# Patient Record
Sex: Male | Born: 1960 | Race: White | Hispanic: No | Marital: Single | Smoking: Never smoker
Health system: Southern US, Community
[De-identification: ages and names within clinical notes are randomized; demographics above are authoritative.]

## PROBLEM LIST (undated history)

## (undated) DIAGNOSIS — E079 Disorder of thyroid, unspecified: Secondary | ICD-10-CM

## (undated) DIAGNOSIS — E7800 Pure hypercholesterolemia, unspecified: Secondary | ICD-10-CM

## (undated) DIAGNOSIS — G309 Alzheimer's disease, unspecified: Secondary | ICD-10-CM

## (undated) DIAGNOSIS — L309 Dermatitis, unspecified: Secondary | ICD-10-CM

## (undated) DIAGNOSIS — Q909 Down syndrome, unspecified: Secondary | ICD-10-CM

## (undated) DIAGNOSIS — J302 Other seasonal allergic rhinitis: Secondary | ICD-10-CM

## (undated) DIAGNOSIS — G4730 Sleep apnea, unspecified: Secondary | ICD-10-CM

## (undated) DIAGNOSIS — L409 Psoriasis, unspecified: Secondary | ICD-10-CM

## (undated) DIAGNOSIS — I82409 Acute embolism and thrombosis of unspecified deep veins of unspecified lower extremity: Secondary | ICD-10-CM

## (undated) DIAGNOSIS — F0280 Dementia in other diseases classified elsewhere without behavioral disturbance: Secondary | ICD-10-CM

## (undated) HISTORY — DX: Dementia in other diseases classified elsewhere, unspecified severity, without behavioral disturbance, psychotic disturbance, mood disturbance, and anxiety: G30.9

## (undated) HISTORY — PX: ANKLE SURGERY: SHX546

---

## 2010-04-10 ENCOUNTER — Emergency Department (HOSPITAL_BASED_OUTPATIENT_CLINIC_OR_DEPARTMENT_OTHER): Admission: EM | Admit: 2010-04-10 | Discharge: 2010-04-10 | Payer: Self-pay | Admitting: Emergency Medicine

## 2011-09-18 ENCOUNTER — Emergency Department (HOSPITAL_BASED_OUTPATIENT_CLINIC_OR_DEPARTMENT_OTHER)
Admission: EM | Admit: 2011-09-18 | Discharge: 2011-09-18 | Disposition: A | Discharge status: Other Acute Inpatient Hospital | Payer: Medicare Other | Attending: Emergency Medicine | Admitting: Emergency Medicine

## 2011-09-18 ENCOUNTER — Encounter: Payer: Self-pay | Admitting: *Deleted

## 2011-09-18 DIAGNOSIS — E079 Disorder of thyroid, unspecified: Secondary | ICD-10-CM | POA: Insufficient documentation

## 2011-09-18 DIAGNOSIS — Q909 Down syndrome, unspecified: Secondary | ICD-10-CM | POA: Insufficient documentation

## 2011-09-18 DIAGNOSIS — G4730 Sleep apnea, unspecified: Secondary | ICD-10-CM | POA: Insufficient documentation

## 2011-09-18 DIAGNOSIS — E7800 Pure hypercholesterolemia, unspecified: Secondary | ICD-10-CM | POA: Insufficient documentation

## 2011-09-18 DIAGNOSIS — M7989 Other specified soft tissue disorders: Secondary | ICD-10-CM | POA: Insufficient documentation

## 2011-09-18 HISTORY — DX: Sleep apnea, unspecified: G47.30

## 2011-09-18 HISTORY — DX: Pure hypercholesterolemia, unspecified: E78.00

## 2011-09-18 HISTORY — DX: Down syndrome, unspecified: Q90.9

## 2011-09-18 HISTORY — DX: Disorder of thyroid, unspecified: E07.9

## 2011-09-18 NOTE — ED Notes (Signed)
Pt presents to ED today with right knee swelling extending down right leg.  Pt reports no injury or trauma.  Pt took [place] last night with no relief in sx

## 2011-09-18 NOTE — ED Provider Notes (Signed)
History     CSN: [id number] Arrival date & time: 09/18/2011 10:23 AM   First MD Initiated Contact with Patient 09/18/11 1101      Chief Complaint  Patient presents with  . Joint Swelling    (Consider location/radiation/quality/duration/timing/severity/associated sxs/prior treatment) HPI Comments: Patient's history is limited due to his Down syndrome.  He has right leg swelling from his knee down to his ankle.  There is no erythema, redness or fevers noted.  No history of gout.  Patient is able to ambulate and can flex and extend his knee without difficulty.  Patient may have had a DVT in the past but it is unclear.  He's not on any anticoagulation or antiplatelet agents at this time.  No shortness of breath or chest pain.  It is unclear how long the swelling has been present.  Patient is a 50 y.o. male presenting with leg pain. The history is provided by the patient and a relative. The history is limited by a developmental delay. No language interpreter was used.  Leg Pain  The incident occurred at home. There was no injury mechanism.    Past Medical History  Diagnosis Date  . Down's syndrome   . Thyroid disease   . Hypercholesteremia   . Sleep apnea     Past Surgical History  Procedure Date  . Ankle surgery     bilateral    History reviewed. No pertinent family history.  History  Substance Use Topics  . Smoking status: Never Smoker   . Smokeless tobacco: Not on file  . Alcohol Use: No      Review of Systems  Constitutional: Negative.  Negative for fever and chills.  HENT: Negative.   Eyes: Negative.  Negative for discharge and redness.  Respiratory: Negative.  Negative for cough and shortness of breath.   Cardiovascular: Negative.  Negative for chest pain.  Gastrointestinal: Negative.  Negative for nausea, vomiting and abdominal pain.  Genitourinary: Negative.  Negative for hematuria.  Musculoskeletal: Positive for joint swelling. Negative for back pain.  Skin:  Negative.  Negative for color change and rash.  Neurological: Negative for syncope and headaches.  Hematological: Negative.  Negative for adenopathy.  Psychiatric/Behavioral: Negative.  Negative for confusion.  All other systems reviewed and are negative.    Allergies  Review of patient's allergies indicates no known allergies.  Home Medications   Current Outpatient Rx  Name Route Sig Dispense Refill  . OMEGA-3 FATTY ACIDS 1000 MG PO CAPS Oral Take 2 g by mouth daily.      . FUROSEMIDE 40 MG PO TABS Oral Take 40 mg by mouth daily.      Marland Kitchen LEVOTHYROXINE SODIUM 88 MCG PO TABS Oral Take 88 mcg by mouth daily.      Marland Kitchen LORATADINE 10 MG PO TABS Oral Take 10 mg by mouth daily.      . MELOXICAM 15 MG PO TABS Oral Take 15 mg by mouth daily.      Marland Kitchen POTASSIUM CHLORIDE CR PO Oral Take by mouth.      Marland Kitchen SIMVASTATIN 80 MG PO TABS Oral Take 80 mg by mouth at bedtime.      . TRIAMCINOLONE ACETONIDE 0.1 % EX CREA Topical Apply topically 2 (two) times daily.        BP 132/60  Pulse 59  Temp(Src) 97.6 F (36.4 C) (Oral)  Resp 20  SpO2 95%  Physical Exam  Constitutional: He appears well-developed and well-nourished.  HENT:  Head: Normocephalic and atraumatic.  Eyes: Conjunctivae and EOM are normal. Pupils are equal, round, and reactive to light.  Neck: Normal range of motion. Neck supple.  Pulmonary/Chest: Effort normal.  Musculoskeletal: He exhibits edema.       Patient has swelling of his right knee down to his right ankle.  No erythema no crepitus no warmth.  Patient is able to flex and extend his knee without difficulty.  Patient is able to bear weight at this time.  Neurological: He is alert.  Skin: Skin is warm and dry. No rash noted. No erythema.  Psychiatric: He has a normal mood and affect. His behavior is normal.    ED Course  Procedures (including critical care time)  Labs Reviewed - No data to display No results found.   No diagnosis found.    MDM  Patient has exam  consistent with possible DVT.  I have no ultrasound technician available today to perform this study necessary.  I have called the emergency physician at [facility] emergency department Dr. Ladona Ridgel and she states that they do have technician there and so I will transfer the patient over to the emergency department for evaluation.  The patient's family members going to drive him over and I feel that is safe at this point in time.        Nat Christen, MD 09/18/11 1115

## 2015-11-05 ENCOUNTER — Emergency Department (HOSPITAL_BASED_OUTPATIENT_CLINIC_OR_DEPARTMENT_OTHER): Payer: Medicare Other

## 2015-11-05 ENCOUNTER — Emergency Department (HOSPITAL_BASED_OUTPATIENT_CLINIC_OR_DEPARTMENT_OTHER)
Admission: EM | Admit: 2015-11-05 | Discharge: 2015-11-06 | Disposition: A | Discharge status: Home / Self Care | Payer: Medicare Other | Attending: Emergency Medicine | Admitting: Emergency Medicine

## 2015-11-05 ENCOUNTER — Encounter (HOSPITAL_BASED_OUTPATIENT_CLINIC_OR_DEPARTMENT_OTHER): Payer: Self-pay

## 2015-11-05 DIAGNOSIS — E079 Disorder of thyroid, unspecified: Secondary | ICD-10-CM | POA: Insufficient documentation

## 2015-11-05 DIAGNOSIS — R262 Difficulty in walking, not elsewhere classified: Secondary | ICD-10-CM | POA: Insufficient documentation

## 2015-11-05 DIAGNOSIS — M79601 Pain in right arm: Secondary | ICD-10-CM

## 2015-11-05 DIAGNOSIS — Q909 Down syndrome, unspecified: Secondary | ICD-10-CM | POA: Diagnosis not present

## 2015-11-05 DIAGNOSIS — M1990 Unspecified osteoarthritis, unspecified site: Secondary | ICD-10-CM | POA: Insufficient documentation

## 2015-11-05 DIAGNOSIS — M25471 Effusion, right ankle: Secondary | ICD-10-CM | POA: Insufficient documentation

## 2015-11-05 DIAGNOSIS — M25572 Pain in left ankle and joints of left foot: Secondary | ICD-10-CM | POA: Insufficient documentation

## 2015-11-05 DIAGNOSIS — Z791 Long term (current) use of non-steroidal anti-inflammatories (NSAID): Secondary | ICD-10-CM | POA: Diagnosis not present

## 2015-11-05 DIAGNOSIS — Z8669 Personal history of other diseases of the nervous system and sense organs: Secondary | ICD-10-CM | POA: Diagnosis not present

## 2015-11-05 DIAGNOSIS — R4182 Altered mental status, unspecified: Secondary | ICD-10-CM | POA: Diagnosis not present

## 2015-11-05 DIAGNOSIS — M25472 Effusion, left ankle: Secondary | ICD-10-CM | POA: Diagnosis not present

## 2015-11-05 DIAGNOSIS — R531 Weakness: Secondary | ICD-10-CM | POA: Diagnosis not present

## 2015-11-05 DIAGNOSIS — Z872 Personal history of diseases of the skin and subcutaneous tissue: Secondary | ICD-10-CM | POA: Insufficient documentation

## 2015-11-05 DIAGNOSIS — R630 Anorexia: Secondary | ICD-10-CM | POA: Insufficient documentation

## 2015-11-05 DIAGNOSIS — M25511 Pain in right shoulder: Secondary | ICD-10-CM | POA: Diagnosis not present

## 2015-11-05 DIAGNOSIS — M7989 Other specified soft tissue disorders: Secondary | ICD-10-CM

## 2015-11-05 DIAGNOSIS — E7800 Pure hypercholesterolemia, unspecified: Secondary | ICD-10-CM | POA: Diagnosis not present

## 2015-11-05 DIAGNOSIS — M79605 Pain in left leg: Secondary | ICD-10-CM | POA: Diagnosis present

## 2015-11-05 DIAGNOSIS — M549 Dorsalgia, unspecified: Secondary | ICD-10-CM | POA: Diagnosis not present

## 2015-11-05 DIAGNOSIS — Z79899 Other long term (current) drug therapy: Secondary | ICD-10-CM | POA: Diagnosis not present

## 2015-11-05 HISTORY — DX: Other seasonal allergic rhinitis: J30.2

## 2015-11-05 HISTORY — DX: Psoriasis, unspecified: L40.9

## 2015-11-05 HISTORY — DX: Dermatitis, unspecified: L30.9

## 2015-11-05 LAB — URINALYSIS, ROUTINE W REFLEX MICROSCOPIC
GLUCOSE, UA: NEGATIVE mg/dL
Hgb urine dipstick: NEGATIVE
KETONES UR: 15 mg/dL — AB
LEUKOCYTES UA: NEGATIVE
NITRITE: NEGATIVE
PROTEIN: 30 mg/dL — AB
Specific Gravity, Urine: 1.042 — ABNORMAL HIGH (ref 1.005–1.030)
pH: 5.5 (ref 5.0–8.0)

## 2015-11-05 LAB — CBC WITH DIFFERENTIAL/PLATELET
Basophils Absolute: 0 10*3/uL (ref 0.0–0.1)
Basophils Relative: 0 %
EOS ABS: 0.1 10*3/uL (ref 0.0–0.7)
Eosinophils Relative: 1 %
HCT: 42.1 % (ref 39.0–52.0)
HEMOGLOBIN: 13.5 g/dL (ref 13.0–17.0)
LYMPHS ABS: 0.6 10*3/uL — AB (ref 0.7–4.0)
Lymphocytes Relative: 6 %
MCH: 28.8 pg (ref 26.0–34.0)
MCHC: 32.1 g/dL (ref 30.0–36.0)
MCV: 90 fL (ref 78.0–100.0)
MONO ABS: 0.8 10*3/uL (ref 0.1–1.0)
MONOS PCT: 7 %
NEUTROS PCT: 86 %
Neutro Abs: 9.9 10*3/uL — ABNORMAL HIGH (ref 1.7–7.7)
Platelets: 231 10*3/uL (ref 150–400)
RBC: 4.68 MIL/uL (ref 4.22–5.81)
RDW: 15.8 % — ABNORMAL HIGH (ref 11.5–15.5)
WBC: 11.6 10*3/uL — ABNORMAL HIGH (ref 4.0–10.5)

## 2015-11-05 LAB — BASIC METABOLIC PANEL
Anion gap: 9 (ref 5–15)
BUN: 21 mg/dL — AB (ref 6–20)
CALCIUM: 8.8 mg/dL — AB (ref 8.9–10.3)
CHLORIDE: 99 mmol/L — AB (ref 101–111)
CO2: 30 mmol/L (ref 22–32)
CREATININE: 0.91 mg/dL (ref 0.61–1.24)
GFR calc Af Amer: >60 mL/min (ref >60)
Glucose, Bld: 124 mg/dL — ABNORMAL HIGH (ref 65–99)
Potassium: 4.2 mmol/L (ref 3.5–5.1)
SODIUM: 138 mmol/L (ref 135–145)

## 2015-11-05 LAB — URINE MICROSCOPIC-ADD ON: WBC UA: NONE SEEN WBC/hpf (ref 0–5)

## 2015-11-05 MED ORDER — ACETAMINOPHEN 500 MG PO TABS
1000.0000 mg | ORAL_TABLET | Freq: Once | ORAL | Status: AC
  Administered 2015-11-05: 1000 mg via ORAL
  Filled 2015-11-05: qty 2

## 2015-11-05 NOTE — ED Notes (Signed)
Patient transported to CT 

## 2015-11-05 NOTE — ED Provider Notes (Signed)
CSN: [id number]     Arrival date & time 11/05/15  1810 History  By signing my name below, I, Javier Morrison, attest that this documentation has been prepared under the direction and in the presence of Melene Plan, DO. Electronically Signed: Linus Morrison, ED Scribe. 11/05/2015. 9:09 PM.   Chief Complaint  Patient presents with  . Difficulty Walking   The history is provided by a caregiver and the patient. No language interpreter was used.   HPI Comments: Javier Morrison with his caregiver is a 55 y.o. male with a h/o Down's Syndrome and arthritis who presents to the Emergency Department complaining of difficulty ambulating since yesterday. Pt's caregiver also reports back pain, right shoulder pain with swelling, left leg pain with weight bearing, decreased appetite, and decreased mental status. Caregiver reports the pt was evaluated by his PCP yesterday and had benign left leg xray. As per caregiver, pt is usually very active with a good appetite at baseline. Pt denies any falls. Pt denies any fevers, chills, CP, SOB, or any other sx at this time.   Past Medical History  Diagnosis Date  . Down's syndrome   . Thyroid disease   . Hypercholesteremia   . Sleep apnea   . Psoriasis   . Seasonal allergies   . Eczema    Past Surgical History  Procedure Laterality Date  . Ankle surgery      bilateral   No family history on file. Social History  Substance Use Topics  . Smoking status: Never Smoker   . Smokeless tobacco: None  . Alcohol Use: No    Review of Systems  Constitutional: Positive for appetite change (decreased). Negative for fever and chills.  HENT: Negative for congestion and facial swelling.   Eyes: Negative for discharge and visual disturbance.  Respiratory: Negative for shortness of breath.   Cardiovascular: Negative for chest pain and palpitations.  Gastrointestinal: Negative for vomiting, abdominal pain and diarrhea.  Musculoskeletal: Positive for myalgias, back pain and  arthralgias.  Skin: Negative for color change and rash.  Neurological: Positive for weakness. Negative for tremors, syncope and headaches.  Psychiatric/Behavioral: Negative for confusion and dysphoric mood.    Allergies  Review of patient's allergies indicates no known allergies.  Home Medications   Prior to Admission medications   Medication Sig Start Date End Date Taking? Authorizing Provider  levothyroxine (SYNTHROID, LEVOTHROID) 88 MCG tablet Take 88 mcg by mouth daily.      Historical Provider, MD  loratadine (CLARITIN) 10 MG tablet Take 10 mg by mouth daily.      Historical Provider, MD  meloxicam (MOBIC) 15 MG tablet Take 15 mg by mouth daily.      Historical Provider, MD  POTASSIUM CHLORIDE CR PO Take by mouth.      Historical Provider, MD  simvastatin (ZOCOR) 80 MG tablet Take 80 mg by mouth at bedtime.      Historical Provider, MD   BP 100/55 mmHg  Pulse 88  Temp(Src) 97.9 F (36.6 C) (Oral)  Resp 20  Ht 5\' 1"  (1.549 m)  Wt 173 lb (78.472 kg)  BMI 32.70 kg/m2  SpO2 95% Physical Exam  Constitutional: He is oriented to person, place, and time. He appears well-developed and well-nourished.  HENT:  Head: Normocephalic and atraumatic.  Eyes: EOM are normal. Pupils are equal, round, and reactive to light.  Neck: Normal range of motion. Neck supple. No JVD present.  Cardiovascular: Normal rate and regular rhythm.  Exam reveals no gallop and no friction  rub.   No murmur heard. Pulmonary/Chest: No respiratory distress. He has no wheezes. He exhibits no tenderness.  Abdominal: He exhibits no distension. There is no tenderness. There is no rebound and no guarding.  Musculoskeletal: Normal range of motion.  Marked swelling RUE with some crepitus and moibility of humeral bone, pulse, motor and sensation intact right in the hand. Palpable clunk felt with internal rotation of left ankle. Left ankle more swollen compared to right. no hip or spine tenderness. No overt signs of trauma   Neurological: He is alert and oriented to person, place, and time.  Skin: No rash noted. No pallor.  Psychiatric: He has a normal mood and affect. His behavior is normal.  Nursing note and vitals reviewed.   ED Course  Procedures  DIAGNOSTIC STUDIES: Oxygen Saturation is 95% on room air, normal by my interpretation.    COORDINATION OF CARE: 8:29 PM WiIl order imaging. Will give Tylenol. Discussed treatment plan with pt at bedside and pt agreed to plan.  Labs Review Labs Reviewed  CBC WITH DIFFERENTIAL/PLATELET - Abnormal; Notable for the following:    WBC 11.6 (*)    RDW 15.8 (*)    Neutro Abs 9.9 (*)    Lymphs Abs 0.6 (*)    All other components within normal limits  BASIC METABOLIC PANEL - Abnormal; Notable for the following:    Chloride 99 (*)    Glucose, Bld 124 (*)    BUN 21 (*)    Calcium 8.8 (*)    All other components within normal limits  URINALYSIS, ROUTINE W REFLEX MICROSCOPIC (NOT AT [facility]) - Abnormal; Notable for the following:    Color, Urine AMBER (*)    APPearance CLOUDY (*)    Specific Gravity, Urine 1.042 (*)    Bilirubin Urine SMALL (*)    Ketones, ur 15 (*)    Protein, ur 30 (*)    All other components within normal limits  URINE MICROSCOPIC-ADD ON - Abnormal; Notable for the following:    Squamous Epithelial / LPF 6-30 (*)    Bacteria, UA RARE (*)    All other components within normal limits    Imaging Review Dg Shoulder Right  11/05/2015  CLINICAL DATA:  Down syndrome. Not walking for 3 days. Left ankle and right shoulder pain. EXAM: RIGHT SHOULDER - 2+ VIEW COMPARISON:  None. FINDINGS: Suggestion of lucency in the right humeral head. This may be due to artifact of positioning but avascular necrosis is not excluded. No evidence of acute fracture or dislocation of the right shoulder. Soft tissues are unremarkable. IMPRESSION: Suggestion of lucency in the right humeral head possibly indicating avascular necrosis. No acute fracture or dislocation.  Electronically Signed   By: Burman Nieves M.D.   On: 11/05/2015 22:57   Dg Elbow Complete Right  11/05/2015  CLINICAL DATA:  Pain. EXAM: RIGHT ELBOW - COMPLETE 3+ VIEW COMPARISON:  None. FINDINGS: There is no evidence of fracture, dislocation, or joint effusion. Osteoarthritic changes of the elbow joint are seen. There is mild posterior soft tissue edema. IV catheter is seen. IMPRESSION: No acute fracture or dislocation identified about the right elbow. Moderate osteoarthritic changes. Electronically Signed   By: Ted Mcalpine M.D.   On: 11/05/2015 22:57   Dg Forearm Right  11/05/2015  CLINICAL DATA:  Right forearm pain. No known injury. Initial encounter. EXAM: RIGHT FOREARM - 2 VIEW COMPARISON:  None. FINDINGS: There is no acute bony or joint abnormality. Advanced for age degenerative disease is seen  about the elbow. No focal bony lesion. IMPRESSION: No acute abnormality. Electronically Signed   By: Drusilla Kanner M.D.   On: 11/05/2015 22:57   Dg Tibia/fibula Left  11/05/2015  CLINICAL DATA:  Down syndrome. Not walking for 3 days. Left ankle and right shoulder pain. EXAM: LEFT TIBIA AND FIBULA - 2 VIEW COMPARISON:  None. FINDINGS: Postoperative changes with plate and screw fixation of an old fracture of the distal left fibula. Old healed fracture deformity of the distal tibia. Prominent degenerative changes demonstrated in the left knee and left ankle. Chondrocalcinosis in the knee. Sclerosis in the distal femoral metaphysis could represent enchondroma or bone infarct. No evidence of acute fracture or dislocation. Vascular calcifications. IMPRESSION: Degenerative changes in the left knee and left ankle. Old fracture deformities in the left ankle. Sclerosis in the distal femoral metaphysis may indicate bone infarct or enchondroma. Electronically Signed   By: Burman Nieves M.D.   On: 11/05/2015 22:59   Dg Ankle Complete Left  11/05/2015  CLINICAL DATA:  Right ankle pain for 3 days. The  patient refuses to walk. No known injury. Initial encounter. EXAM: LEFT ANKLE COMPLETE - 3+ VIEW COMPARISON:  None. FINDINGS: There is no acute bony or joint abnormality. Plate and screw fixation of remote distal fibular fracture is identified. The patient has markedly advanced tibiotalar osteoarthritis. Soft tissues are unremarkable. IMPRESSION: No acute abnormality. Remote healed fibular fracture with fixation hardware in place. Advanced tibiotalar osteoarthritis. Electronically Signed   By: Drusilla Kanner M.D.   On: 11/05/2015 22:58   Ct Head Wo Contrast  11/05/2015  CLINICAL DATA:  Not walking for 3 days. Complains of left ankle and right shoulder pain. History of Down's syndrome. EXAM: CT HEAD WITHOUT CONTRAST TECHNIQUE: Contiguous axial images were obtained from the base of the skull through the vertex without intravenous contrast. COMPARISON:  None. FINDINGS: Mild diffuse cerebral atrophy. No ventricular dilatation. No mass effect or midline shift. No abnormal extra-axial fluid collections. Gray-white matter junctions are distinct. Basal cisterns are not effaced. No evidence of acute intracranial hemorrhage. No depressed skull fractures. Mucosal thickening versus retention cyst seen in the right maxillary antrum. No acute air-fluid levels in the paranasal sinuses. Hypoaeration of left mastoid air cells. No mastoid effusions. Congenital nonunion of the posterior arch of C1. IMPRESSION: No acute intracranial abnormalities.  Mild atrophy. Electronically Signed   By: Burman Nieves M.D.   On: 11/05/2015 22:54   Dg Hip Unilat With Pelvis 2-3 Views Left  11/05/2015  CLINICAL DATA:  The patient refuses to walk. Left hip pain. Initial encounter. No known injury. EXAM: DG HIP (WITH OR WITHOUT PELVIS) 2-3V LEFT COMPARISON:  None. FINDINGS: There is no evidence of hip fracture or dislocation. There is no evidence of arthropathy or other focal bone abnormality. IMPRESSION: Negative exam. Electronically Signed    By: Drusilla Kanner M.D.   On: 11/05/2015 22:59   Dg Femur Min 2 Views Left  11/05/2015  CLINICAL DATA:  Pain, inability to ambulate. EXAM: LEFT FEMUR 2 VIEWS COMPARISON:  None. FINDINGS: There is no evidence of fracture or other focal bone lesions. An area of 2.6 cm coarse calcifications are seen within the soft tissues lateral to the distal femoral shaft, with uncertain significance. IMPRESSION: No acute fracture or dislocation identified about the left femur. 2.6 cm area of coarse soft tissue calcifications lateral to the distal femoral shaft. This may represent a sequela of prior trauma, such as calcified intramuscular hematoma. Partially calcified soft tissue mass is  also of consideration. Follow-up in 3-6 months may be considered. Electronically Signed   By: Ted Mcalpine M.D.   On: 11/05/2015 23:02   I have personally reviewed and evaluated these images and lab results as part of my medical decision-making.   EKG Interpretation None      MDM   Final diagnoses:  Left ankle pain    55 yo M with a significant past medical history of Down syndrome comes in with a chief complaint of refusing to walk. This been going on for the past couple days. Patient was seen by their family physician and had a normal x-ray of the left foot. Difficult to obtain history second 2 patient ability to communicate. Felt crepitus in the left ankle as well as had significant pain to the right humerus. Images of these areas did not show any acute fractures. Will place the patient in a ankle stirrup. Have him follow-up with his orthopedic physician.  11:18 PM:  I have discussed the diagnosis/risks/treatment options with the patient and family and believe the pt to be eligible for discharge home to follow-up with Ortho. We also discussed returning to the ED immediately if new or worsening sx occur. We discussed the sx which are most concerning (e.g., sudden worsening pain, fever, inability to tolerate by mouth)  that necessitate immediate return. Medications administered to the patient during their visit and any new prescriptions provided to the patient are listed below.  Medications given during this visit Medications  acetaminophen (TYLENOL) tablet 1,000 mg (1,000 mg Oral Given 11/05/15 2047)    New Prescriptions   No medications on file    The patient appears reasonably screen and/or stabilized for discharge and I doubt any other medical condition or other [facility] requiring further screening, evaluation, or treatment in the ED at this time prior to discharge.     I personally performed the services described in this documentation, which was scribed in my presence. The recorded information has been reviewed and is accurate.    Melene Plan, DO 11/05/15 2318

## 2015-11-05 NOTE — ED Notes (Signed)
Patient transported to X-ray 

## 2015-11-05 NOTE — Discharge Instructions (Signed)
I am not sure of the etiology of him not bearing weight.  The xrays showed no acute broken bones.  Follow up with his ortho.  Ankle Pain Ankle pain is a common symptom. The bones, cartilage, tendons, and muscles of the ankle joint perform a lot of work each day. The ankle joint holds your body weight and allows you to move around. Ankle pain can occur on either side or back of 1 or both ankles. Ankle pain may be sharp and burning or dull and aching. There may be tenderness, stiffness, redness, or warmth around the ankle. The pain occurs more often when a person walks or puts pressure on the ankle. CAUSES  There are many reasons ankle pain can develop. It is important to work with your caregiver to identify the cause since many conditions can impact the bones, cartilage, muscles, and tendons. Causes for ankle pain include:  Injury, including a break (fracture), sprain, or strain often due to a fall, sports, or a high-impact activity.  Swelling (inflammation) of a tendon (tendonitis).  Achilles tendon rupture.  Ankle instability after repeated sprains and strains.  Poor foot alignment.  Pressure on a nerve (tarsal tunnel syndrome).  Arthritis in the ankle or the lining of the ankle.  Crystal formation in the ankle (gout or pseudogout). DIAGNOSIS  A diagnosis is based on your medical history, your symptoms, results of your physical exam, and results of diagnostic tests. Diagnostic tests may include X-ray exams or a computerized magnetic scan (magnetic resonance imaging, MRI). TREATMENT  Treatment will depend on the cause of your ankle pain and may include:  Keeping pressure off the ankle and limiting activities.  Using crutches or other walking support (a cane or brace).  Using rest, ice, compression, and elevation.  Participating in physical therapy or home exercises.  Wearing shoe inserts or special shoes.  Losing weight.  Taking medications to reduce pain or swelling or  receiving an injection.  Undergoing surgery. HOME CARE INSTRUCTIONS   Only take over-the-counter or prescription medicines for pain, discomfort, or fever as directed by your caregiver.  Put ice on the injured area.  Put ice in a plastic bag.  Place a towel between your skin and the bag.  Leave the ice on for 15-20 minutes at a time, 03-04 times a day.  Keep your leg raised (elevated) when possible to lessen swelling.  Avoid activities that cause ankle pain.  Follow specific exercises as directed by your caregiver.  Record how often you have ankle pain, the location of the pain, and what it feels like. This information may be helpful to you and your caregiver.  Ask your caregiver about returning to work or sports and whether you should drive.  Follow up with your caregiver for further examination, therapy, or testing as directed. SEEK MEDICAL CARE IF:   Pain or swelling continues or worsens beyond 1 week.  You have an oral temperature above 102 F (38.9 C).  You are feeling unwell or have chills.  You are having an increasingly difficult time with walking.  You have loss of sensation or other new symptoms.  You have questions or concerns. MAKE SURE YOU:   Understand these instructions.  Will watch your condition.  Will get help right away if you are not doing well or get worse.   This information is not intended to replace advice given to you by your health care provider. Make sure you discuss any questions you have with your health care provider.  Document Released: 03/17/2010 Document Revised: 12/20/2011 Document Reviewed: 04/29/2015 Elsevier Interactive Patient Education [employer]

## 2015-11-05 NOTE — ED Notes (Signed)
[facility] home staff member Min[facility] states pt with difficulty walking yesterday-c/o back pain and shoulder pain since Monday-denies injury,fevers,-was seen by PCP yesterday-xray of left foot with negative-pt alert-NAD-when asked if havin pain pt points to right shoulder-denies again any injury

## 2018-08-22 ENCOUNTER — Emergency Department (HOSPITAL_BASED_OUTPATIENT_CLINIC_OR_DEPARTMENT_OTHER)
Admission: EM | Admit: 2018-08-22 | Discharge: 2018-08-22 | Disposition: A | Discharge status: Home / Self Care | Payer: Medicare Other | Attending: Emergency Medicine | Admitting: Emergency Medicine

## 2018-08-22 ENCOUNTER — Other Ambulatory Visit: Payer: Self-pay

## 2018-08-22 ENCOUNTER — Emergency Department (HOSPITAL_BASED_OUTPATIENT_CLINIC_OR_DEPARTMENT_OTHER): Payer: Medicare Other

## 2018-08-22 ENCOUNTER — Encounter (HOSPITAL_BASED_OUTPATIENT_CLINIC_OR_DEPARTMENT_OTHER): Payer: Self-pay | Admitting: *Deleted

## 2018-08-22 DIAGNOSIS — Z7901 Long term (current) use of anticoagulants: Secondary | ICD-10-CM | POA: Insufficient documentation

## 2018-08-22 DIAGNOSIS — Z79899 Other long term (current) drug therapy: Secondary | ICD-10-CM | POA: Diagnosis not present

## 2018-08-22 DIAGNOSIS — R0609 Other forms of dyspnea: Secondary | ICD-10-CM | POA: Diagnosis not present

## 2018-08-22 DIAGNOSIS — Z7982 Long term (current) use of aspirin: Secondary | ICD-10-CM | POA: Insufficient documentation

## 2018-08-22 DIAGNOSIS — Q909 Down syndrome, unspecified: Secondary | ICD-10-CM | POA: Insufficient documentation

## 2018-08-22 DIAGNOSIS — M79642 Pain in left hand: Secondary | ICD-10-CM | POA: Insufficient documentation

## 2018-08-22 LAB — COMPREHENSIVE METABOLIC PANEL
ALBUMIN: 2.8 g/dL — AB (ref 3.5–5.0)
ALT: 21 U/L (ref 0–44)
AST: 24 U/L (ref 15–41)
Alkaline Phosphatase: 70 U/L (ref 38–126)
Anion gap: 11 (ref 5–15)
BUN: 23 mg/dL — ABNORMAL HIGH (ref 6–20)
CO2: 27 mmol/L (ref 22–32)
CREATININE: 0.84 mg/dL (ref 0.61–1.24)
Calcium: 8.7 mg/dL — ABNORMAL LOW (ref 8.9–10.3)
Chloride: 97 mmol/L — ABNORMAL LOW (ref 98–111)
GFR calc non Af Amer: >60 mL/min (ref >60)
Glucose, Bld: 87 mg/dL (ref 70–99)
Potassium: 4 mmol/L (ref 3.5–5.1)
SODIUM: 135 mmol/L (ref 135–145)
Total Bilirubin: 0.6 mg/dL (ref 0.3–1.2)
Total Protein: 7.2 g/dL (ref 6.5–8.1)

## 2018-08-22 LAB — CBC WITH DIFFERENTIAL/PLATELET
ABS IMMATURE GRANULOCYTES: 0.02 10*3/uL (ref 0.00–0.07)
BASOS ABS: 0 10*3/uL (ref 0.0–0.1)
BASOS PCT: 0 %
Eosinophils Absolute: 0 10*3/uL (ref 0.0–0.5)
Eosinophils Relative: 0 %
HCT: 39.3 % (ref 39.0–52.0)
Hemoglobin: 12.4 g/dL — ABNORMAL LOW (ref 13.0–17.0)
Immature Granulocytes: 0 %
LYMPHS PCT: 13 %
Lymphs Abs: 1 10*3/uL (ref 0.7–4.0)
MCH: 28.2 pg (ref 26.0–34.0)
MCHC: 31.6 g/dL (ref 30.0–36.0)
MCV: 89.5 fL (ref 80.0–100.0)
Monocytes Absolute: 0.6 10*3/uL (ref 0.1–1.0)
Monocytes Relative: 8 %
NEUTROS ABS: 5.9 10*3/uL (ref 1.7–7.7)
NRBC: 0 % (ref 0.0–0.2)
Neutrophils Relative %: 79 %
Platelets: 324 10*3/uL (ref 150–400)
RBC: 4.39 MIL/uL (ref 4.22–5.81)
RDW: 16.5 % — ABNORMAL HIGH (ref 11.5–15.5)
WBC: 7.6 10*3/uL (ref 4.0–10.5)

## 2018-08-22 LAB — TROPONIN I: Troponin I: <0.03 ng/mL (ref <0.03)

## 2018-08-22 LAB — BRAIN NATRIURETIC PEPTIDE: B NATRIURETIC PEPTIDE 5: 72.1 pg/mL (ref 0.0–100.0)

## 2018-08-22 MED ORDER — IOPAMIDOL (ISOVUE-370) INJECTION 76%
100.0000 mL | Freq: Once | INTRAVENOUS | Status: AC | PRN
  Administered 2018-08-22: 100 mL via INTRAVENOUS

## 2018-08-22 NOTE — ED Notes (Signed)
Pt on monitor 

## 2018-08-22 NOTE — ED Provider Notes (Signed)
[facility] EMERGENCY DEPARTMENT Provider Note   CSN: [id number] Arrival date & time: 08/22/18  1012     History   Chief Complaint Chief Complaint  Patient presents with  . Hand Pain    HPI Javier Morrison is a 57 y.o. male.  The history is provided by the patient.  Hand Pain  This is a new problem. Episode onset: 1 month. The problem occurs constantly. The problem has been gradually worsening. Associated symptoms comments: Exertional SOB, left hand pain and arm swelling.  Decreased appetite and when he tries to walk he becomes winded and will turn white.  Pt currently complaining about pain in the palm of his left hand.  Caregiver states that he saw his doctor on Sunday and they did blood work which they said was normal and then put him on an antibiotic.  However she denies any fever or infectious symptoms.. The symptoms are aggravated by bending and twisting. Nothing relieves the symptoms. He has tried acetaminophen for the symptoms. The treatment provided no relief.    Past Medical History:  Diagnosis Date  . Down's syndrome   . Eczema   . Hypercholesteremia   . Psoriasis   . Seasonal allergies   . Sleep apnea   . Thyroid disease     There are no active problems to display for this patient.   Past Surgical History:  Procedure Laterality Date  . ANKLE SURGERY     bilateral        Home Medications    Prior to Admission medications   Medication Sig Start Date End Date Taking? Authorizing Provider  aspirin 81 MG chewable tablet Chew by mouth daily.   Yes [provider]  levothyroxine (SYNTHROID, LEVOTHROID) 88 MCG tablet Take 88 mcg by mouth daily.     Yes [provider]  loratadine (CLARITIN) 10 MG tablet Take 10 mg by mouth daily.     Yes [provider]  POTASSIUM CHLORIDE CR PO Take by mouth.     Yes [provider]  rivaroxaban (XARELTO) 10 MG TABS tablet Take 10 mg by mouth daily.   Yes [provider]    simvastatin (ZOCOR) 80 MG tablet Take 80 mg by mouth at bedtime.     Yes [provider]  meloxicam (MOBIC) 15 MG tablet Take 15 mg by mouth daily.      [provider]    Family History No family history on file.  Social History Social History   Tobacco Use  . Smoking status: Never Smoker  Substance Use Topics  . Alcohol use: No  . Drug use: No     Allergies   Myrbetriq [mirabegron]   Review of Systems Review of Systems  All other systems reviewed and are negative.    Physical Exam Updated Vital Signs BP 123/80 (BP Location: Left Arm)   Pulse (!) 53   Temp 98.1 F (36.7 C) (Oral)   Resp 18   Ht 5' (1.524 m)   Wt 74.4 kg   SpO2 100%   BMI 32.03 kg/m   Physical Exam  Constitutional: He is oriented to person, place, and time. He appears well-developed and well-nourished. No distress.  HENT:  Head: Normocephalic and atraumatic.  Mouth/Throat: Oropharynx is clear and moist.  Typical facial features consistent with Down syndrome  Eyes: Pupils are equal, round, and reactive to light. Conjunctivae and EOM are normal.  Neck: Normal range of motion. Neck supple.  Cardiovascular: Regular rhythm and intact  distal pulses. Bradycardia present.  No murmur heard. Pulmonary/Chest: Effort normal and breath sounds normal. No respiratory distress. He has no wheezes. He has no rales.  Abdominal: Soft. He exhibits no distension. There is no tenderness. There is no rebound and no guarding.  Musculoskeletal: Normal range of motion. He exhibits tenderness. He exhibits no edema.  Nonpitting edema to the LUE.  2+ radial pulse and normal sensation in the hand.  Pain with palpation of the palmar wrist and thenar emminence.  Neurological: He is alert and oriented to person, place, and time.  Skin: Skin is warm and dry. No rash noted. No erythema.  Psychiatric: He has a normal mood and affect. His behavior is normal.  Nursing note and vitals reviewed.    ED  Treatments / Results  Labs (all labs ordered are listed, but only abnormal results are displayed) Labs Reviewed  CBC WITH DIFFERENTIAL/PLATELET - Abnormal; Notable for the following components:      Result Value   Hemoglobin 12.4 (*)    RDW 16.5 (*)    All other components within normal limits  COMPREHENSIVE METABOLIC PANEL - Abnormal; Notable for the following components:   Chloride 97 (*)    BUN 23 (*)    Calcium 8.7 (*)    Albumin 2.8 (*)    All other components within normal limits  BRAIN NATRIURETIC PEPTIDE  TROPONIN I    EKG EKG Interpretation  Date/Time:  Tuesday August 22 2018 11:42:43 EST Ventricular Rate:  47 PR Interval:    QRS Duration: 121 QT Interval:  441 QTC Calculation: 390 R Axis:   81 Text Interpretation:  Sinus bradycardia Borderline prolonged PR interval Right bundle branch block No previous tracing Confirmed by Gwyneth Sprout ([id number]) on 08/22/2018 12:06:50 PM   Radiology Dg Chest 2 View  Result Date: 08/22/2018 CLINICAL DATA:  Shortness of breath with decreased oxygen saturation EXAM: CHEST - 2 VIEW COMPARISON:  None. FINDINGS: There is no appreciable edema or consolidation. Heart size and pulmonary vascularity are normal. There is epicardial fat prominence on the right. No adenopathy. No pneumothorax. No bone lesions. IMPRESSION: No edema or consolidation. Heart size within normal limits. Prominent epicardial fat along the right heart border. Electronically Signed   By: Bretta Bang III M.D.   On: 08/22/2018 11:57   Ct Angio Chest Pe W And/or Wo Contrast  Result Date: 08/22/2018 CLINICAL DATA:  Shortness of breath and history of prior deep venous thrombosis EXAM: CT ANGIOGRAPHY CHEST WITH CONTRAST TECHNIQUE: Multidetector CT imaging of the chest was performed using the standard protocol during bolus administration of intravenous contrast. Multiplanar CT image reconstructions and MIPs were obtained to evaluate the vascular anatomy. CONTRAST:   ISOVUE-370 IOPAMIDOL (ISOVUE-370) INJECTION 76% COMPARISON:  Chest radiograph August 22, 2018 FINDINGS: Cardiovascular: There is no demonstrable pulmonary embolus. There is no thoracic aortic aneurysm or dissection. The visualized great vessels appear unremarkable. There is no pericardial effusion or pericardial thickening. Mediastinum/Nodes: Thyroid appears unremarkable. There is no appreciable thoracic adenopathy. No esophageal lesions are evident. There is prominent epicardial fat adjacent to the right heart border. Lungs/Pleura: There is no edema or consolidation. There is patchy atelectatic change bilaterally. Relative mosaic appearance of the lungs may indicate small airways obstructive disease associated with atelectasis. There is no pleural effusion or pleural thickening. Upper Abdomen: Reflux of contrast into the inferior vena cava and hepatic veins is noted. Visualized upper abdominal structures otherwise appear unremarkable. Musculoskeletal: There is arthropathy in each shoulder. There is evidence of  remodeling in the right humeral head with subchondral cystic change. Flattening in this area suggests underlying avascular necrosis. There is intra-articular calcification in the right shoulder. No blastic or lytic bone lesions are identified. No evident chest wall lesions. Review of the MIP images confirms the above findings. IMPRESSION: 1. No demonstrable pulmonary embolus. No thoracic aortic aneurysm or dissection. 2. Atelectasis with probable underlying small airways obstructive disease. No edema or consolidation. 3.  No evident thoracic adenopathy. 4. Prominent epicardial fat along the right heart border, a finding felt to be of benign etiology. 5. Reflux of contrast into the inferior vena cava and hepatic veins, a finding that may indicate a degree of increase in right heart pressure. 6. 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 Arthropathy in each shoulder with  intra-articular calcification bilaterally. 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 Electronically Signed   By: Bretta BangWilliam  Woodruff III M.D.   On: 08/22/2018 13:11   [place] Venous Img Upper Left (dvt Study)  Result Date: 08/22/2018 CLINICAL DATA:  57 year old male with left upper extremity pain and swelling EXAM: LEFT UPPER EXTREMITY VENOUS DOPPLER ULTRASOUND TECHNIQUE: Gray-scale sonography with graded compression, as well as color Doppler and duplex ultrasound were performed to evaluate the upper extremity deep venous system from the level of the subclavian vein and including the jugular, axillary, basilic, radial, ulnar and upper cephalic vein. Spectral Doppler was utilized to evaluate flow at rest and with distal augmentation maneuvers. COMPARISON:  None. FINDINGS: Contralateral Subclavian Vein: Respiratory phasicity is normal and symmetric with the symptomatic side. No evidence of thrombus. Normal compressibility. Internal Jugular Vein: No evidence of thrombus. Normal compressibility, respiratory phasicity and response to augmentation. Subclavian Vein: No evidence of thrombus. Normal compressibility, respiratory phasicity and response to augmentation. Axillary Vein: No evidence of thrombus. Normal compressibility, respiratory phasicity and response to augmentation. Cephalic Vein: No evidence of thrombus. Normal compressibility, respiratory phasicity and response to augmentation. Basilic Vein: No evidence of thrombus. Normal compressibility, respiratory phasicity and response to augmentation. Brachial Veins: No evidence of thrombus. Normal compressibility, respiratory phasicity and response to augmentation. Radial Veins: No evidence of thrombus. Normal compressibility, respiratory phasicity and response to augmentation. Ulnar Veins: No evidence of thrombus. Normal compressibility, respiratory phasicity and response to augmentation. Venous Reflux:  None visualized. Other Findings:  None visualized. IMPRESSION:  No evidence of DVT within the left upper extremity. Electronically Signed   By: Malachy MoanHeath  McCullough M.D.   On: 08/22/2018 12:36    Procedures Procedures (including critical care time)  Medications Ordered in ED Medications - No data to display   Initial Impression / Assessment and Plan / ED Course  I have reviewed the triage vital signs and the nursing notes.  Pertinent labs & imaging results that were available during my care of the patient were reviewed by me and considered in my medical decision making (see chart for details).     Patient is a 57 year old male with a history of Down syndrome presenting with his caretaker today due to worsening symptoms over the last 1 month.  She states about a month ago he was diagnosed with a DVT in the lower extremity and was changed from meloxicam to Xarelto.  She states since starting that medication he has had gradually worsening pain and swelling in his left upper extremity as well as exertional dyspnea and hypoxia with exertion.  Patient has no known cardiac defects or problems.  He has no prior history of CHF.  He does not have a history  of hypertension.  He has been seeing his doctor for this problem.  He was diagnosed with pseudogout as the cause for his pain but it is not improving.  They did start him on antibiotics on Sunday but there is been no improvement.  Facility states they are just worried about him because he is not himself.  The only complaint he has is of left hand pain and they state he is not using his hand.  Prior to the hand hurting he did complain of neck pain but denies it now.  He is in no acute distress on exam.  He does not appear fluid overloaded however he does have edema of the left upper extremity.  He is neurovascularly intact.  Breath sounds are clear and he does not have signs of fluid overload.  Concern for possible PE as he does have history of DVT, anemia, ACS as the cause for his symptoms.  Ultrasound of the left upper  extremity pending to rule out DVT.  CBC, CMP, troponin, BNP, chest x-ray pending.  Patient's EKG shows a sinus bradycardia with borderline prolonged PR interval and a right bundle branch block without old to compare.  1:42 PM Patient's labs are reassuring, troponin is negative, chest x-ray without acute findings.  CT negative for PE but did show some reflux of contrast into the IVC and hepatic veins that could have some increased right heart pressure.  Concern for possible pulmonary hypertension.  Caretaker states he recently had an echo that he was told is within normal limits.  Low suspicion for ACS as a cause of his symptoms.  Patient was ambulated here and dropped to the mid 80's and then came right back up.  Pt was not tachypneic with walking and no color change.  He has appointment with Pcp on Thursday and feel he would benefit from pulm consult.  Final Clinical Impressions(s) / ED Diagnoses   Final diagnoses:  Left hand pain  Exertional dyspnea    ED Discharge Orders    None       Gwyneth Sprout, MD 08/22/18 1402

## 2018-08-22 NOTE — ED Notes (Signed)
Patient ambulated in the hallways. No distressed noted. Pt dropped SpO2 to 84%. No increased WOB noted. Caregiver at bedside.

## 2018-08-22 NOTE — ED Notes (Signed)
Patient transported to X-ray 

## 2018-08-22 NOTE — ED Notes (Signed)
Patient transported to CT 

## 2018-08-22 NOTE — ED Notes (Signed)
ED Provider at bedside. 

## 2018-08-22 NOTE — ED Triage Notes (Signed)
Pt was taken off of maloxicam and was put on Xarelto on 08/08/18. Pt has a DVT in his leg (not sure which one). Caregiver says that his hand has been hurting and swelling ever since medication change and that oxygen sats has been low.

## 2018-08-22 NOTE — ED Notes (Addendum)
D/c home with caregiver. Pt alert and oriented. NAD

## 2019-07-04 IMAGING — CT CT ANGIO CHEST
2 of 8 series · 18 of 36 positions shown · IV contrast (iopamidol)
Comparison: Chest radiograph August 22, 2018

CLINICAL DATA: Shortness of breath and history of prior deep venous
thrombosis

EXAM:
CT ANGIOGRAPHY CHEST WITH CONTRAST
TECHNIQUE: Multidetector CT imaging of the chest was performed using the
standard protocol during bolus administration of intravenous
contrast. Multiplanar CT image reconstructions and MIPs were
obtained to evaluate the vascular anatomy.
CONTRAST:  100mL 23X8M5-DXG IOPAMIDOL (23X8M5-DXG) INJECTION 76%

[Series 6: pe thins · axial · 0.76mm/px · z∈[-238,+14]mm · 17 of 282 slices shown]
[im 15/282  lung]
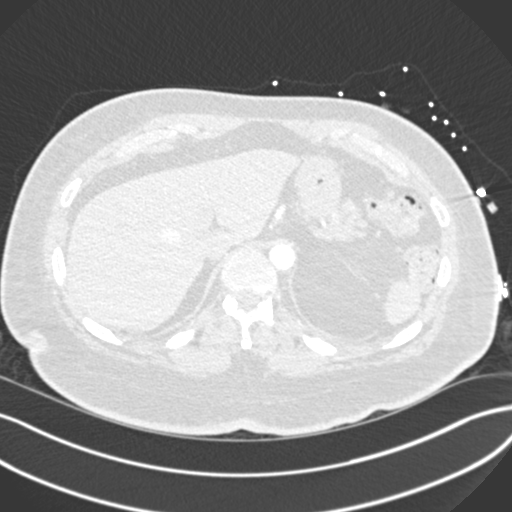
[im 30/282  mediastinal]
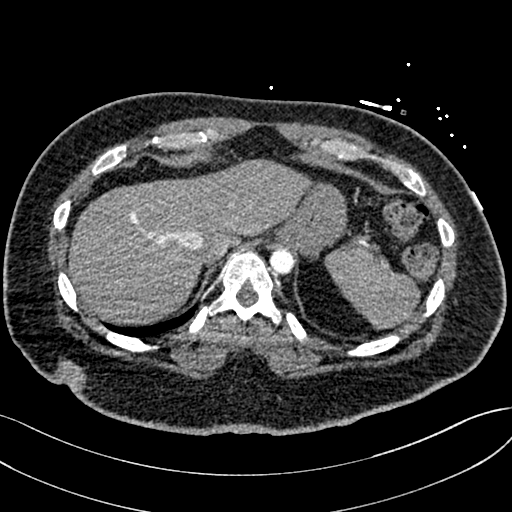
[im 45/282  lung]
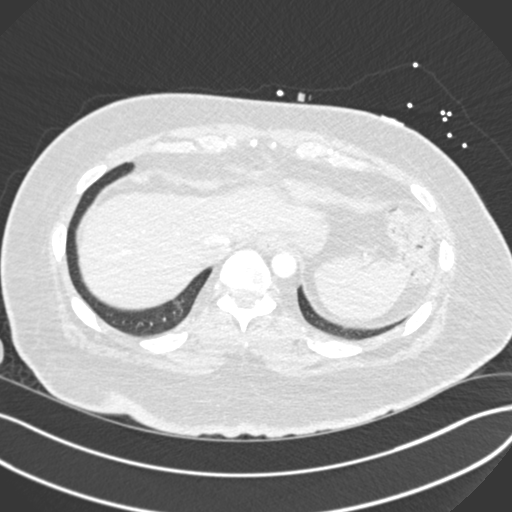
[im 60/282  mediastinal]
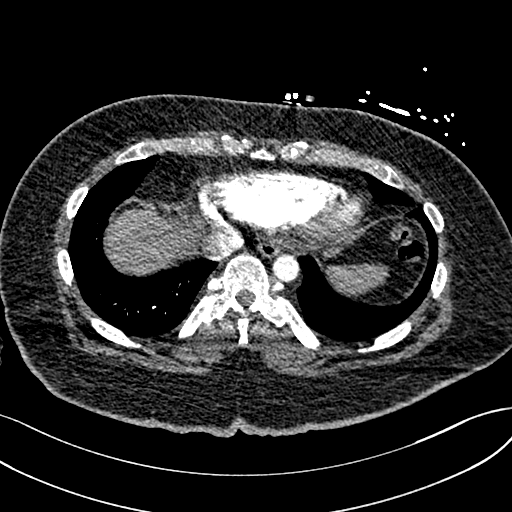
[im 74/282  lung]
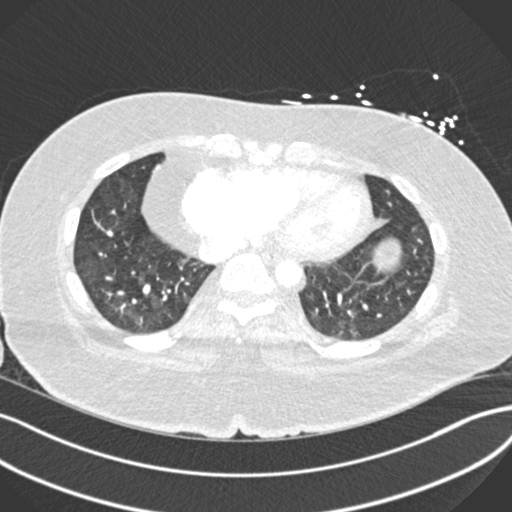
[im 89/282  mediastinal]
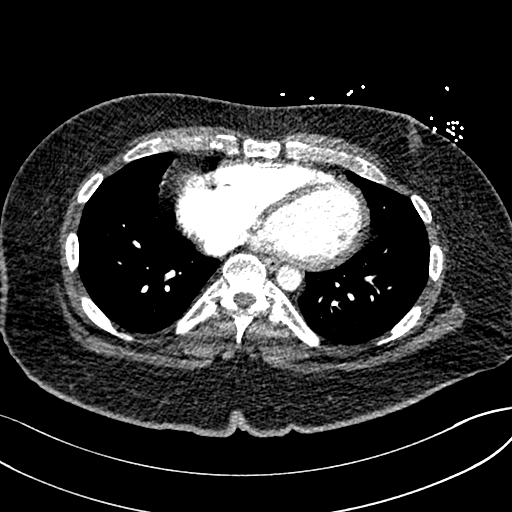
[im 104/282  lung]
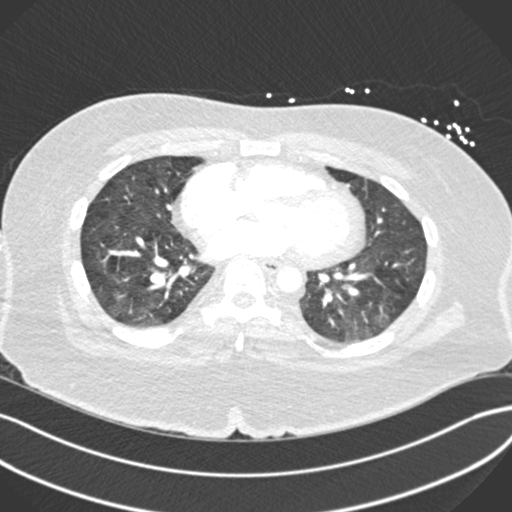
[im 119/282  mediastinal]
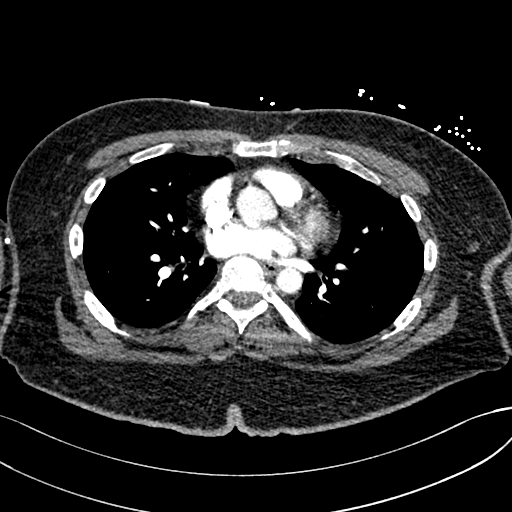
[im 148/282  lung]
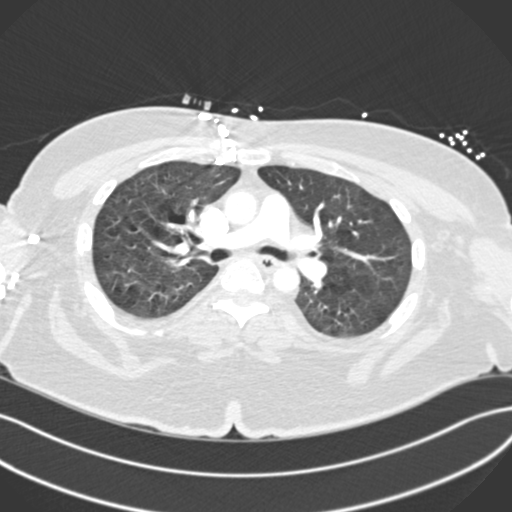
[im 163/282  mediastinal]
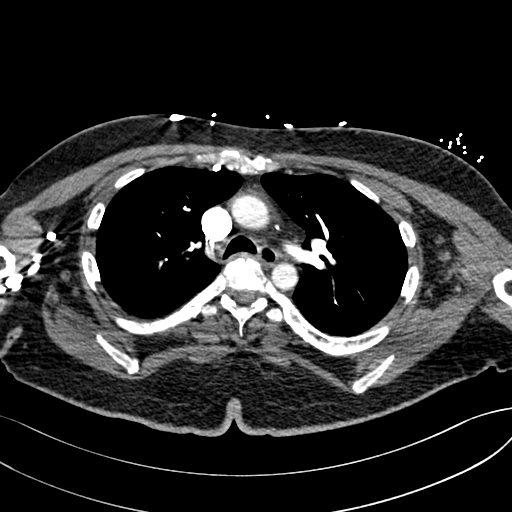
[im 178/282  lung]
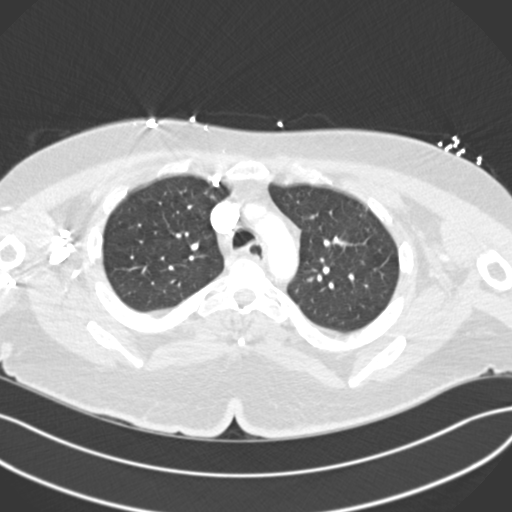
[im 193/282  mediastinal]
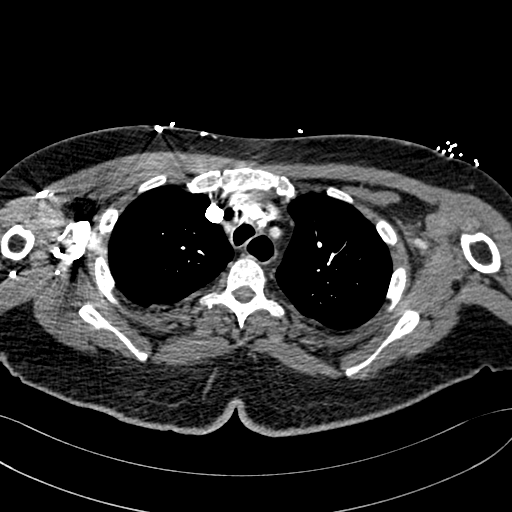
[im 208/282  lung]
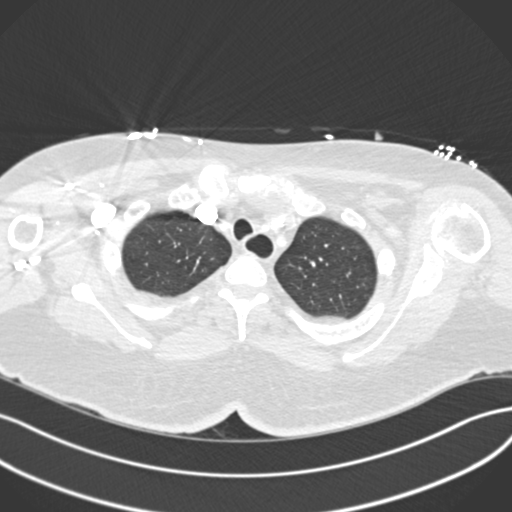
[im 222/282  mediastinal]
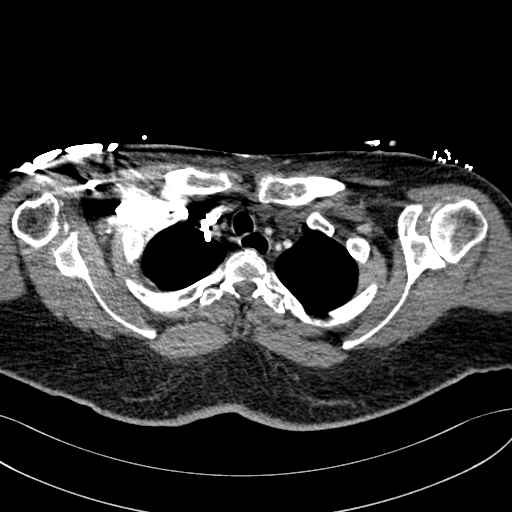
[im 237/282  lung]
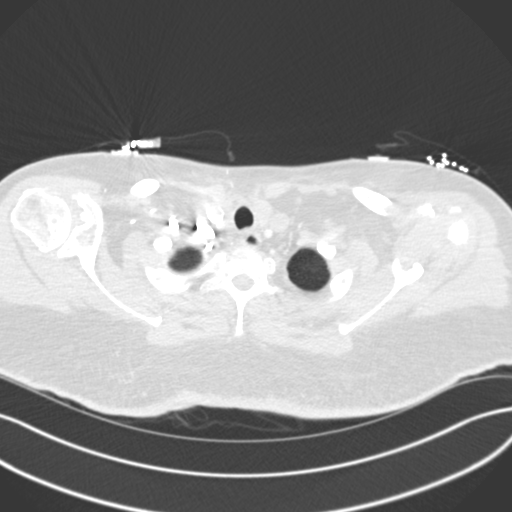
[im 252/282  mediastinal]
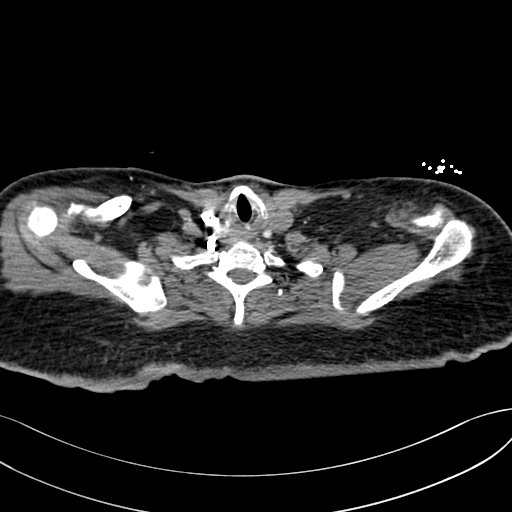
[im 267/282  lung]
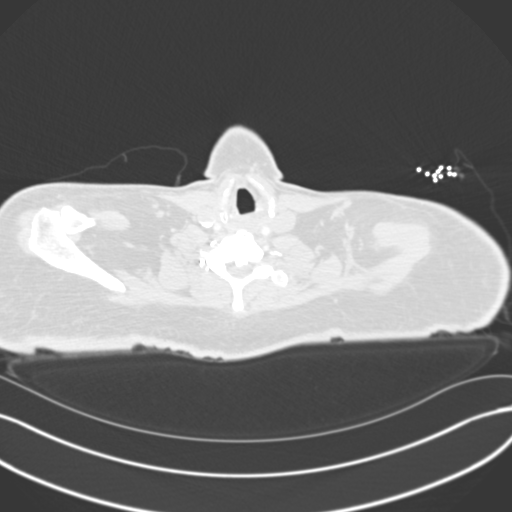

[Series 7: pe coronal mpr · coronal · 0.56mm/px · 1 of 132 slices shown]
[im 66/132  mediastinal]
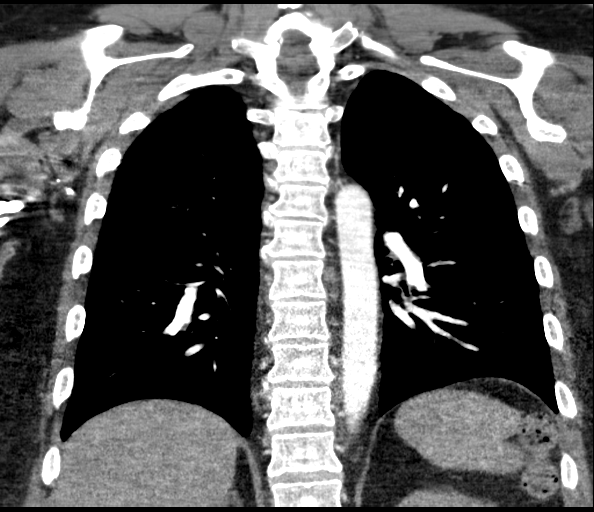

[18 of 36 positions shown; findings below may reference images not displayed]

FINDINGS: Cardiovascular: There is no demonstrable pulmonary embolus. There is
no thoracic aortic aneurysm or dissection. The visualized great
vessels appear unremarkable. There is no pericardial effusion or
pericardial thickening.

Mediastinum/Nodes: Thyroid appears unremarkable. There is no
appreciable thoracic adenopathy. No esophageal lesions are evident.
There is prominent epicardial fat adjacent to the right heart
border.

Lungs/Pleura: There is no edema or consolidation. There is patchy
atelectatic change bilaterally. Relative mosaic appearance of the
lungs may indicate small airways obstructive disease associated with
atelectasis. There is no pleural effusion or pleural thickening.

Upper Abdomen: Reflux of contrast into the inferior vena cava and
hepatic veins is noted. Visualized upper abdominal structures
otherwise appear unremarkable.

Musculoskeletal: There is arthropathy in each shoulder. There is
evidence of remodeling in the right humeral head with subchondral
cystic change. Flattening in this area suggests underlying avascular
necrosis. There is intra-articular calcification in the right
shoulder. No blastic or lytic bone lesions are identified. No
evident chest wall lesions.

Review of the MIP images confirms the above findings.
IMPRESSION: 1. No demonstrable pulmonary embolus. No thoracic aortic aneurysm or
dissection.

2. Atelectasis with probable underlying small airways obstructive
disease. No edema or consolidation.

3.  No evident thoracic adenopathy.

4. Prominent epicardial fat along the right heart border, a finding
felt to be of benign etiology.

5. Reflux of contrast into the inferior vena cava and hepatic veins,
a finding that may indicate a degree of increase in right heart
pressure.

6. Apparent avascular necrosis in the right humeral head region.
Arthropathy in each shoulder with intra-articular calcification
bilaterally. Suspect a degree of synovial chondromatosis.

## 2021-06-24 ENCOUNTER — Other Ambulatory Visit: Payer: Self-pay

## 2021-06-24 ENCOUNTER — Encounter (HOSPITAL_BASED_OUTPATIENT_CLINIC_OR_DEPARTMENT_OTHER): Payer: Self-pay

## 2021-06-24 ENCOUNTER — Emergency Department (HOSPITAL_BASED_OUTPATIENT_CLINIC_OR_DEPARTMENT_OTHER)
Admission: EM | Admit: 2021-06-24 | Discharge: 2021-06-24 | Disposition: A | Discharge status: Home / Self Care | Payer: Medicare Other | Attending: Emergency Medicine | Admitting: Emergency Medicine

## 2021-06-24 DIAGNOSIS — W010XXA Fall on same level from slipping, tripping and stumbling without subsequent striking against object, initial encounter: Secondary | ICD-10-CM | POA: Insufficient documentation

## 2021-06-24 DIAGNOSIS — Z7982 Long term (current) use of aspirin: Secondary | ICD-10-CM | POA: Diagnosis not present

## 2021-06-24 DIAGNOSIS — I951 Orthostatic hypotension: Secondary | ICD-10-CM | POA: Insufficient documentation

## 2021-06-24 DIAGNOSIS — Z7901 Long term (current) use of anticoagulants: Secondary | ICD-10-CM | POA: Diagnosis not present

## 2021-06-24 DIAGNOSIS — W19XXXA Unspecified fall, initial encounter: Secondary | ICD-10-CM

## 2021-06-24 DIAGNOSIS — Y92009 Unspecified place in unspecified non-institutional (private) residence as the place of occurrence of the external cause: Secondary | ICD-10-CM | POA: Diagnosis not present

## 2021-06-24 DIAGNOSIS — R42 Dizziness and giddiness: Secondary | ICD-10-CM | POA: Diagnosis present

## 2021-06-24 LAB — URINALYSIS, ROUTINE W REFLEX MICROSCOPIC
Bilirubin Urine: NEGATIVE
Glucose, UA: NEGATIVE mg/dL
Hgb urine dipstick: NEGATIVE
Ketones, ur: NEGATIVE mg/dL
Leukocytes,Ua: NEGATIVE
Nitrite: NEGATIVE
Protein, ur: NEGATIVE mg/dL
Specific Gravity, Urine: 1.01 (ref 1.005–1.030)
pH: 5 (ref 5.0–8.0)

## 2021-06-24 LAB — CBC WITH DIFFERENTIAL/PLATELET
Abs Immature Granulocytes: 0.02 10*3/uL (ref 0.00–0.07)
Basophils Absolute: 0.1 10*3/uL (ref 0.0–0.1)
Basophils Relative: 1 %
Eosinophils Absolute: 0.1 10*3/uL (ref 0.0–0.5)
Eosinophils Relative: 1 %
HCT: 43.4 % (ref 39.0–52.0)
Hemoglobin: 14.2 g/dL (ref 13.0–17.0)
Immature Granulocytes: 0 %
Lymphocytes Relative: 11 %
Lymphs Abs: 0.7 10*3/uL (ref 0.7–4.0)
MCH: 28.6 pg (ref 26.0–34.0)
MCHC: 32.7 g/dL (ref 30.0–36.0)
MCV: 87.5 fL (ref 80.0–100.0)
Monocytes Absolute: 0.4 10*3/uL (ref 0.1–1.0)
Monocytes Relative: 6 %
Neutro Abs: 5.1 10*3/uL (ref 1.7–7.7)
Neutrophils Relative %: 81 %
Platelets: 256 10*3/uL (ref 150–400)
RBC: 4.96 MIL/uL (ref 4.22–5.81)
RDW: 18.4 % — ABNORMAL HIGH (ref 11.5–15.5)
WBC: 6.4 10*3/uL (ref 4.0–10.5)
nRBC: 0 % (ref 0.0–0.2)

## 2021-06-24 LAB — COMPREHENSIVE METABOLIC PANEL
ALT: 23 U/L (ref 0–44)
AST: 29 U/L (ref 15–41)
Albumin: 3.9 g/dL (ref 3.5–5.0)
Alkaline Phosphatase: 95 U/L (ref 38–126)
Anion gap: 8 (ref 5–15)
BUN: 24 mg/dL — ABNORMAL HIGH (ref 6–20)
CO2: 31 mmol/L (ref 22–32)
Calcium: 9.5 mg/dL (ref 8.9–10.3)
Chloride: 98 mmol/L (ref 98–111)
Creatinine, Ser: 1.09 mg/dL (ref 0.61–1.24)
GFR, Estimated: >60 mL/min (ref >60)
Glucose, Bld: 102 mg/dL — ABNORMAL HIGH (ref 70–99)
Potassium: 4 mmol/L (ref 3.5–5.1)
Sodium: 137 mmol/L (ref 135–145)
Total Bilirubin: 1 mg/dL (ref 0.3–1.2)
Total Protein: 7.9 g/dL (ref 6.5–8.1)

## 2021-06-24 NOTE — ED Provider Notes (Signed)
[facility] EMERGENCY DEPARTMENT Provider Note   CSN: [id number] Arrival date & time: 06/24/21  1014     History Chief Complaint  Patient presents with   Javier Morrison is a 60 y.o. male with history of down's syndrome who presents after a fall. Group home manager at bedside who states patient fell once yesterday afternoon and once this morning. Both times he landed on his bottom. They checked his blood pressure after each fall which were normal, but oxygen saturation at home was low in the 60's. No head trauma or LOC. Patient states he felt dizzy before each fall. He is not complaining of any pain anywhere. No recent illness. No other complaints.   Fall Pertinent negatives include no chest pain, no abdominal pain, no headaches and no shortness of breath.      Past Medical History:  Diagnosis Date   Down's syndrome    Eczema    Hypercholesteremia    Psoriasis    Seasonal allergies    Sleep apnea    Thyroid disease     There are no problems to display for this patient.   Past Surgical History:  Procedure Laterality Date   ANKLE SURGERY     bilateral       No family history on file.  Social History   Tobacco Use   Smoking status: Never   Smokeless tobacco: Never  Vaping Use   Vaping Use: Never used  Substance Use Topics   Alcohol use: No   Drug use: No    Home Medications Prior to Admission medications   Medication Sig Start Date End Date Taking? Authorizing Provider  aspirin 81 MG chewable tablet Chew by mouth daily.    [provider]  levothyroxine (SYNTHROID, LEVOTHROID) 88 MCG tablet Take 88 mcg by mouth daily.      [provider]  loratadine (CLARITIN) 10 MG tablet Take 10 mg by mouth daily.      [provider]  meloxicam (MOBIC) 15 MG tablet Take 15 mg by mouth daily.      [provider]  POTASSIUM CHLORIDE CR PO Take by mouth.      [provider]  rivaroxaban (XARELTO) 10 MG TABS  tablet Take 10 mg by mouth daily.    [provider]  simvastatin (ZOCOR) 80 MG tablet Take 80 mg by mouth at bedtime.      [provider]    Allergies    Myrbetriq [mirabegron]  Review of Systems   Review of Systems  Constitutional:  Negative for chills and fever.  Respiratory:  Negative for shortness of breath.   Cardiovascular:  Negative for chest pain.  Gastrointestinal:  Negative for abdominal pain.  Neurological:  Positive for dizziness and tremors. Negative for headaches.       Hands have been shaking  All other systems reviewed and are negative.  Physical Exam Updated Vital Signs BP (!) 144/70 (BP Location: Right Arm)   Pulse (!) 51   Temp 98.2 F (36.8 C) (Oral)   Resp 18   Ht 5\' 1"  (1.549 m)   Wt 71.2 kg   SpO2 100%   BMI 29.66 kg/m   Physical Exam Vitals and nursing note reviewed.  Constitutional:      Appearance: Normal appearance.  HENT:     Head: Normocephalic and atraumatic.  Eyes:     Conjunctiva/sclera: Conjunctivae normal.  Cardiovascular:     Rate and Rhythm: Normal rate and regular  rhythm.  Pulmonary:     Effort: Pulmonary effort is normal. No respiratory distress.     Breath sounds: Normal breath sounds.  Abdominal:     General: There is no distension.     Palpations: Abdomen is soft.     Tenderness: There is no abdominal tenderness.  Skin:    General: Skin is warm and dry.  Neurological:     General: No focal deficit present.     Mental Status: He is alert.     Comments: No tremors. Normal grip strength and sensation in UE.    ED Results / Procedures / Treatments   Labs (all labs ordered are listed, but only abnormal results are displayed) Labs Reviewed  CBC WITH DIFFERENTIAL/PLATELET - Abnormal; Notable for the following components:      Result Value   RDW 18.4 (*)    All other components within normal limits  COMPREHENSIVE METABOLIC PANEL - Abnormal; Notable for the following components:   Glucose, Bld 102 (*)     BUN 24 (*)    All other components within normal limits  URINALYSIS, ROUTINE W REFLEX MICROSCOPIC    EKG EKG Interpretation  Date/Time:  Wednesday June 24 2021 10:37:22 EDT Ventricular Rate:  46 PR Interval:  206 QRS Duration: 94 QT Interval:  408 QTC Calculation: 357 R Axis:   86 Text Interpretation: Sinus bradycardia Incomplete right bundle branch block Borderline ECG Confirmed by Gloris Manchester [phone]) on 06/24/2021 4:00:22 PM  Radiology No results found.  Procedures Procedures   Medications Ordered in ED Medications - No data to display  ED Course  I have reviewed the triage vital signs and the nursing notes.  Pertinent labs & imaging results that were available during my care of the patient were reviewed by me and considered in my medical decision making (see chart for details).    MDM Rules/Calculators/A&P                           Patient is 60 y/o male who presents after two falls with dizziness. No head trauma or LOC. Both falls he fell onto his backside. Is not complaining of any pain.  Patient persistently bradycardic, compared to prior visits this seems to be his baseline. Patient is afebrile and oxygen saturation 100% on RA. CBC, CMP, and urinalysis unremarkable. Positive orthostatic vital signs. Patient's symptoms likely related to orthostatic hypotension. He is otherwise stable and not requiring admission or inpatient treatment of his symptoms. Patient would benefit from increased fluid intake and close follow up of BP at home. Discussed findings with patient and caregiver who are agreeable to plan.   Patient discussed and seen in conjunction with attending physician Dr. Durwin Nora MD who agrees with above plan.   Final Clinical Impression(s) / ED Diagnoses Final diagnoses:  Fall, initial encounter  Orthostatic hypotension    Rx / DC Orders ED Discharge Orders     None        Ahaana Rochette, Lora Paula, PA-C 06/24/21 1706    Gloris Manchester, MD 06/25/21  1437

## 2021-06-24 NOTE — Discharge Instructions (Addendum)
Your lab workup today was reassuring.   Your dizziness is likely caused low blood pressure due to dehydration. Make sure you're drinking lots of fluids and continue monitoring your symptoms.  Return to ED for new or worsening dizziness or falls.

## 2021-06-24 NOTE — ED Triage Notes (Signed)
Pt arrives with group home manager who reports patient fell yesterday, dropping landing on his bottom. Reports that he did the same this morning. They checked blood pressure which they report being normal states that his pulse ox was low. 99% RA in triage with HR of 48

## 2023-07-03 ENCOUNTER — Encounter (HOSPITAL_BASED_OUTPATIENT_CLINIC_OR_DEPARTMENT_OTHER): Payer: Self-pay

## 2023-07-03 ENCOUNTER — Other Ambulatory Visit: Payer: Self-pay

## 2023-07-03 ENCOUNTER — Emergency Department (HOSPITAL_BASED_OUTPATIENT_CLINIC_OR_DEPARTMENT_OTHER)
Admission: EM | Admit: 2023-07-03 | Discharge: 2023-07-03 | Discharge status: Against Medical Advice | Payer: Medicare Other | Attending: Emergency Medicine | Admitting: Emergency Medicine

## 2023-07-03 DIAGNOSIS — Z5321 Procedure and treatment not carried out due to patient leaving prior to being seen by health care provider: Secondary | ICD-10-CM | POA: Insufficient documentation

## 2023-07-03 DIAGNOSIS — R2243 Localized swelling, mass and lump, lower limb, bilateral: Secondary | ICD-10-CM | POA: Diagnosis present

## 2023-07-03 HISTORY — DX: Acute embolism and thrombosis of unspecified deep veins of unspecified lower extremity: I82.409

## 2023-07-03 HISTORY — DX: Dementia in other diseases classified elsewhere, unspecified severity, without behavioral disturbance, psychotic disturbance, mood disturbance, and anxiety: F02.80

## 2023-07-03 NOTE — ED Triage Notes (Signed)
The patient has bil leg swelling with possible wounds to heels. He is saying it hurts.

## 2023-07-07 ENCOUNTER — Emergency Department (HOSPITAL_BASED_OUTPATIENT_CLINIC_OR_DEPARTMENT_OTHER): Payer: Medicare Other

## 2023-07-07 ENCOUNTER — Other Ambulatory Visit: Payer: Self-pay

## 2023-07-07 ENCOUNTER — Encounter (HOSPITAL_BASED_OUTPATIENT_CLINIC_OR_DEPARTMENT_OTHER): Payer: Self-pay | Admitting: Emergency Medicine

## 2023-07-07 ENCOUNTER — Emergency Department (HOSPITAL_BASED_OUTPATIENT_CLINIC_OR_DEPARTMENT_OTHER)
Admission: EM | Admit: 2023-07-07 | Discharge: 2023-07-12 | Disposition: A | Discharge status: Skilled Nursing Facility | Payer: Medicare Other | Attending: Emergency Medicine | Admitting: Emergency Medicine

## 2023-07-07 DIAGNOSIS — J189 Pneumonia, unspecified organism: Secondary | ICD-10-CM | POA: Diagnosis not present

## 2023-07-07 DIAGNOSIS — R531 Weakness: Secondary | ICD-10-CM | POA: Diagnosis present

## 2023-07-07 DIAGNOSIS — Z7901 Long term (current) use of anticoagulants: Secondary | ICD-10-CM | POA: Insufficient documentation

## 2023-07-07 DIAGNOSIS — R9431 Abnormal electrocardiogram [ECG] [EKG]: Secondary | ICD-10-CM | POA: Insufficient documentation

## 2023-07-07 DIAGNOSIS — M25562 Pain in left knee: Secondary | ICD-10-CM | POA: Diagnosis present

## 2023-07-07 DIAGNOSIS — R519 Headache, unspecified: Secondary | ICD-10-CM | POA: Diagnosis not present

## 2023-07-07 DIAGNOSIS — Z7982 Long term (current) use of aspirin: Secondary | ICD-10-CM | POA: Diagnosis not present

## 2023-07-07 DIAGNOSIS — R2243 Localized swelling, mass and lump, lower limb, bilateral: Secondary | ICD-10-CM | POA: Insufficient documentation

## 2023-07-07 DIAGNOSIS — Q909 Down syndrome, unspecified: Secondary | ICD-10-CM | POA: Diagnosis not present

## 2023-07-07 DIAGNOSIS — Z79899 Other long term (current) drug therapy: Secondary | ICD-10-CM | POA: Insufficient documentation

## 2023-07-07 DIAGNOSIS — F0390 Unspecified dementia without behavioral disturbance: Secondary | ICD-10-CM | POA: Insufficient documentation

## 2023-07-07 DIAGNOSIS — Z1152 Encounter for screening for COVID-19: Secondary | ICD-10-CM | POA: Insufficient documentation

## 2023-07-07 DIAGNOSIS — M25561 Pain in right knee: Secondary | ICD-10-CM | POA: Diagnosis present

## 2023-07-07 LAB — COMPREHENSIVE METABOLIC PANEL
ALT: 28 U/L (ref 0–44)
AST: 33 U/L (ref 15–41)
Albumin: 3.4 g/dL — ABNORMAL LOW (ref 3.5–5.0)
Alkaline Phosphatase: 98 U/L (ref 38–126)
Anion gap: 10 (ref 5–15)
BUN: 32 mg/dL — ABNORMAL HIGH (ref 6–20)
CO2: 27 mmol/L (ref 22–32)
Calcium: 8.6 mg/dL — ABNORMAL LOW (ref 8.9–10.3)
Chloride: 99 mmol/L (ref 98–111)
Creatinine, Ser: 1.05 mg/dL (ref 0.61–1.24)
GFR, Estimated: >60 mL/min (ref >60)
Glucose, Bld: 104 mg/dL — ABNORMAL HIGH (ref 70–99)
Potassium: 4.5 mmol/L (ref 3.5–5.1)
Sodium: 136 mmol/L (ref 135–145)
Total Bilirubin: 1 mg/dL (ref 0.3–1.2)
Total Protein: 7.4 g/dL (ref 6.5–8.1)

## 2023-07-07 LAB — CBC WITH DIFFERENTIAL/PLATELET
Abs Immature Granulocytes: 0.03 10*3/uL (ref 0.00–0.07)
Basophils Absolute: 0 10*3/uL (ref 0.0–0.1)
Basophils Relative: 1 %
Eosinophils Absolute: 0 10*3/uL (ref 0.0–0.5)
Eosinophils Relative: 0 %
HCT: 37.4 % — ABNORMAL LOW (ref 39.0–52.0)
Hemoglobin: 12.3 g/dL — ABNORMAL LOW (ref 13.0–17.0)
Immature Granulocytes: 1 %
Lymphocytes Relative: 8 %
Lymphs Abs: 0.5 10*3/uL — ABNORMAL LOW (ref 0.7–4.0)
MCH: 29.7 pg (ref 26.0–34.0)
MCHC: 32.9 g/dL (ref 30.0–36.0)
MCV: 90.3 fL (ref 80.0–100.0)
Monocytes Absolute: 0.6 10*3/uL (ref 0.1–1.0)
Monocytes Relative: 9 %
Neutro Abs: 5.3 10*3/uL (ref 1.7–7.7)
Neutrophils Relative %: 81 %
Platelets: 249 10*3/uL (ref 150–400)
RBC: 4.14 MIL/uL — ABNORMAL LOW (ref 4.22–5.81)
RDW: 17.6 % — ABNORMAL HIGH (ref 11.5–15.5)
WBC: 6.5 10*3/uL (ref 4.0–10.5)
nRBC: 0 % (ref 0.0–0.2)

## 2023-07-07 LAB — RESP PANEL BY RT-PCR (RSV, FLU A&B, COVID)  RVPGX2
Influenza A by PCR: NEGATIVE
Influenza B by PCR: NEGATIVE
Resp Syncytial Virus by PCR: NEGATIVE
SARS Coronavirus 2 by RT PCR: NEGATIVE

## 2023-07-07 LAB — URINALYSIS, W/ REFLEX TO CULTURE (INFECTION SUSPECTED)
Bacteria, UA: NONE SEEN
Bilirubin Urine: NEGATIVE
Glucose, UA: NEGATIVE mg/dL
Hgb urine dipstick: NEGATIVE
Ketones, ur: NEGATIVE mg/dL
Leukocytes,Ua: NEGATIVE
Nitrite: NEGATIVE
Protein, ur: NEGATIVE mg/dL
RBC / HPF: NONE SEEN RBC/hpf (ref 0–5)
Specific Gravity, Urine: 1.025 (ref 1.005–1.030)
pH: 6.5 (ref 5.0–8.0)

## 2023-07-07 LAB — SALICYLATE LEVEL: Salicylate Lvl: <7 mg/dL — ABNORMAL LOW (ref 7.0–30.0)

## 2023-07-07 LAB — AMMONIA: Ammonia: <10 umol/L (ref 9–35)

## 2023-07-07 LAB — TSH: TSH: 36.329 u[IU]/mL — ABNORMAL HIGH (ref 0.350–4.500)

## 2023-07-07 LAB — MAGNESIUM: Magnesium: 2 mg/dL (ref 1.7–2.4)

## 2023-07-07 LAB — ETHANOL: Alcohol, Ethyl (B): <10 mg/dL (ref <10)

## 2023-07-07 LAB — ACETAMINOPHEN LEVEL: Acetaminophen (Tylenol), Serum: 12 ug/mL (ref 10–30)

## 2023-07-07 MED ORDER — AZITHROMYCIN 250 MG PO TABS
250.0000 mg | ORAL_TABLET | Freq: Every day | ORAL | Status: AC
  Administered 2023-07-08 – 2023-07-11 (×4): 250 mg via ORAL
  Filled 2023-07-07 (×4): qty 1

## 2023-07-07 MED ORDER — SODIUM CHLORIDE 0.9 % IV SOLN
1.0000 g | Freq: Once | INTRAVENOUS | Status: AC
  Administered 2023-07-07: 1 g via INTRAVENOUS
  Filled 2023-07-07: qty 10

## 2023-07-07 MED ORDER — AMOXICILLIN-POT CLAVULANATE 875-125 MG PO TABS
1.0000 | ORAL_TABLET | Freq: Two times a day (BID) | ORAL | Status: DC
  Administered 2023-07-08 – 2023-07-12 (×9): 1 via ORAL
  Filled 2023-07-07 (×9): qty 1

## 2023-07-07 MED ORDER — ATORVASTATIN CALCIUM 40 MG PO TABS
40.0000 mg | ORAL_TABLET | Freq: Every day | ORAL | Status: DC
  Administered 2023-07-08 – 2023-07-12 (×5): 40 mg via ORAL
  Filled 2023-07-07 (×5): qty 1

## 2023-07-07 MED ORDER — RIVAROXABAN 10 MG PO TABS: 10.0000 mg | ORAL_TABLET | Freq: Every day | ORAL | Status: DC

## 2023-07-07 MED ORDER — MELOXICAM 7.5 MG PO TABS
15.0000 mg | ORAL_TABLET | Freq: Every day | ORAL | Status: DC
  Administered 2023-07-08 – 2023-07-12 (×5): 15 mg via ORAL
  Filled 2023-07-07 (×6): qty 2

## 2023-07-07 MED ORDER — SODIUM CHLORIDE 0.9 % IV SOLN
500.0000 mg | Freq: Once | INTRAVENOUS | Status: AC
  Administered 2023-07-07: 500 mg via INTRAVENOUS
  Filled 2023-07-07: qty 5

## 2023-07-07 MED ORDER — ACETAMINOPHEN 325 MG PO TABS
650.0000 mg | ORAL_TABLET | Freq: Once | ORAL | Status: AC
  Administered 2023-07-07: 650 mg via ORAL
  Filled 2023-07-07: qty 2

## 2023-07-07 MED ORDER — LEVOTHYROXINE SODIUM 88 MCG PO TABS
88.0000 ug | ORAL_TABLET | Freq: Every day | ORAL | Status: DC
  Administered 2023-07-08 – 2023-07-10 (×3): 88 ug via ORAL
  Filled 2023-07-07 (×3): qty 1

## 2023-07-07 MED ORDER — ASPIRIN 81 MG PO CHEW
81.0000 mg | CHEWABLE_TABLET | Freq: Every day | ORAL | Status: DC
  Administered 2023-07-08 – 2023-07-12 (×5): 81 mg via ORAL
  Filled 2023-07-07 (×5): qty 1

## 2023-07-07 MED ORDER — LORATADINE 10 MG PO TABS
10.0000 mg | ORAL_TABLET | Freq: Every day | ORAL | Status: DC
  Administered 2023-07-08 – 2023-07-12 (×5): 10 mg via ORAL
  Filled 2023-07-07 (×5): qty 1

## 2023-07-07 NOTE — ED Triage Notes (Signed)
Brought in by [facility] , bilateral knee pain x 1 week , seen for it twice this week . Hx down syndrome , verbal but poor historian , family in route per [facility].  No fall or recent injury .normally ambulatory per [facility] . Bilateral leg edema . Hx DVT , pulse present and equal bilaterally

## 2023-07-07 NOTE — ED Notes (Signed)
Called [facility] for transport pt going ed [facility] ed at [facility] accepting Dr. Dalene Seltzer

## 2023-07-07 NOTE — Progress Notes (Addendum)
Per chart review received a consult for this patient for possible SNF. However this CSW was not notified. At this time CSW did reach out to the current PA to inform/confirm of patient [facility] for PT evaluation for possible placement. TOC will continue to follow    Addend@ 6:59 pm   CSW spoke to sister Billy-Joan at this time patient's sister stated she can not care for the patient as she is also handicap in a wheel chair as well requires o2.  CSW did explain the SNF placement process however also explained that SNF placement needs to be recommended by PT. AT this time the patient will transfer to continue evaluation for possible placement. TOC will continue to follow.

## 2023-07-07 NOTE — ED Notes (Signed)
Lean cuisine chicken and mashed potatoes Lemon/Lime Soda 237 ml Peanut butter crackers

## 2023-07-07 NOTE — ED Provider Notes (Signed)
[facility] Provider Note   CSN: [id number] Arrival date & time: 07/07/23  1047     History  Chief Complaint  Patient presents with   Knee Pain    bilateral    Javier Morrison is a 62 y.o. male.  62 year old male presents medical history significant for Down syndrome presents today for concern of bilateral lower leg swelling and pain.  Denies any recent injury.  Has history of DVT.  Patient sister later arrived.  Patient lives with his sister who is his primary caregiver.  She states patient had significant weakness predominantly overnight.  He cannot ambulate anymore.  She states at baseline he would typically ambulate.  No infectious symptoms that they can recall.  She states he has also been somewhat confused.  She attributes this to his dementia which is rapidly progressing.  The history is provided by the patient. No language interpreter was used.       Home Medications Prior to Admission medications   Medication Sig Start Date End Date Taking? Authorizing Provider  aspirin 81 MG chewable tablet Chew by mouth daily.    [provider]  levothyroxine (SYNTHROID, LEVOTHROID) 88 MCG tablet Take 88 mcg by mouth daily.      [provider]  loratadine (CLARITIN) 10 MG tablet Take 10 mg by mouth daily.      [provider]  meloxicam (MOBIC) 15 MG tablet Take 15 mg by mouth daily.      [provider]  POTASSIUM CHLORIDE CR PO Take by mouth.      [provider]  rivaroxaban (XARELTO) 10 MG TABS tablet Take 10 mg by mouth daily.    [provider]  simvastatin (ZOCOR) 80 MG tablet Take 80 mg by mouth at bedtime.      [provider]      Allergies    Myrbetriq [mirabegron]    Review of Systems   Review of Systems  Unable to perform ROS: Dementia  Constitutional:  Negative for fever.  Respiratory:  Negative for cough.   Gastrointestinal:  Negative for abdominal pain.   Musculoskeletal:  Positive for arthralgias.    Physical Exam Updated Vital Signs BP 127/61   Pulse 66   Temp 98.5 F (36.9 C)   Resp 16   SpO2 97%  Physical Exam Vitals and nursing note reviewed.  Constitutional:      General: He is not in acute distress.    Appearance: Normal appearance. He is not ill-appearing.  HENT:     Head: Normocephalic and atraumatic.     Nose: Nose normal.  Eyes:     Conjunctiva/sclera: Conjunctivae normal.  Pulmonary:     Effort: Pulmonary effort is normal. No respiratory distress.  Musculoskeletal:        General: Tenderness present. No deformity. Normal range of motion.     Right lower leg: Edema present.     Left lower leg: Edema present.  Skin:    Findings: No rash.  Neurological:     Mental Status: He is alert.     ED Results / Procedures / Treatments   Labs (all labs ordered are listed, but only abnormal results are displayed) Labs Reviewed - No data to display  EKG None  Radiology No results found.  Procedures Procedures    Medications Ordered in ED Medications - No data to display  ED Course/ Medical Decision Making/ A&P  Medical Decision Making Amount and/or Complexity of Data Reviewed Labs: ordered. Radiology: ordered.  Risk OTC drugs.   62 year old male presents today for concern of bilateral lower extremity swelling and pain.  However there are bigger concern according to sister who is a primary caregiver is the drastic weakness occurring overnight and some confusion but she attributes this to his dementia.  Initially x-rays and bilateral lower extremity ultrasounds were obtained.  No evidence of acute fracture but x-rays do demonstrate significant degenerative change.  Negative DVT study bilaterally. Blood work obtained as well as CT head and chest x-ray to look for metabolic cause of patient's weakness.  CBC without leukocytosis or anemia beyond patient's baseline.  CMP without  renal insufficiency or acute electrolyte change.  UA without evidence of UTI.  Acetaminophen, salicylate, ethanol level within normal.  Ammonia level within normal.  CT head no acute intracranial process.  Chest x-ray does show some evidence of pneumonia.  Low suspicion for pneumonia given he is without cough, fever, or white count.  Will give IV antibiotics and discussed with hospitalist for admission.  Family is concerned that they are unable to care for the patient and are unable to take care of at home.  They state that they have already initiated the process for SNF placement through their social worker outpatient.  Currently patient is pending consultation with the hospitalist for potential admission.  If patient cannot be admitted he will require boarding in the emergency department as family does not feel that it is safe for them to take the patient home.  I spoke to case management who stated patient will need to be transferred ED to the ED for CST consult, PT and OT evaluation SNF placement.  Discussed with hospitalist.  Patient does not meet admission criteria.  Will transfer patient to [facility], ED to ED for [facility], PT and OT evaluation.  PT and OT eval ordered.  Patient's family aware.   Final Clinical Impression(s) / ED Diagnoses Final diagnoses:  Weakness  Dementia, unspecified dementia severity, unspecified dementia type, unspecified whether behavioral, psychotic, or mood disturbance or anxiety [facility])    Rx / DC Orders ED Discharge Orders     None         Marita Kansas, PA-C 07/07/23 1858    Sloan Leiter, DO 07/13/23 [phone]

## 2023-07-07 NOTE — ED Notes (Signed)
Fall risk armband Fall risk socks Fall risk sign on door

## 2023-07-07 NOTE — ED Triage Notes (Signed)
Pt transfer for placement from [facility]

## 2023-07-07 NOTE — ED Provider Notes (Signed)
  Physical Exam  BP (!) 97/53 (BP Location: Right Arm)   Pulse (!) 58   Temp 98.3 F (36.8 C) (Oral)   Resp 15   SpO2 98%   Physical Exam Constitutional:      Appearance: Normal appearance.  HENT:     Head: Atraumatic.     Right Ear: External ear normal.     Left Ear: External ear normal.  Cardiovascular:     Rate and Rhythm: Normal rate and regular rhythm.     Heart sounds: Normal heart sounds.  Pulmonary:     Effort: Pulmonary effort is normal.     Breath sounds: Normal breath sounds.  Musculoskeletal:        General: Normal range of motion.     Cervical back: Normal range of motion and neck supple.     Right lower leg: No edema.     Left lower leg: No edema.  Neurological:     Mental Status: He is alert.     Procedures  Procedures  ED Course / MDM    Medical Decision Making Amount and/or Complexity of Data Reviewed Labs: ordered. Radiology: ordered.  Risk OTC drugs. Prescription drug management.   Patient was transferred to our emergency department for evaluation by physical and Occupational Therapy and placement.  Was found to have pneumonia.  I have ordered the patient antibiotics to start tomorrow since he is already being treated by ceftriaxone and azithromycin.  Overall well-appearing on exam at this time.       Rondel Baton, MD 07/08/23 [phone]

## 2023-07-07 NOTE — ED Notes (Signed)
Patient transported to X-ray 

## 2023-07-07 NOTE — ED Notes (Signed)
[facility] at bedside

## 2023-07-07 NOTE — ED Notes (Signed)
Attempted to ambulate pt in room with x2 assistance to stand.  Pt able to bear partial weight while holding onto 2 staff members.  When asked to take steps forwards, pt refused multiple attempts. Pt assisted back to bed, call bell within reach, family at bedside. EDP made aware.

## 2023-07-08 LAB — T4, FREE: Free T4: 0.58 ng/dL — ABNORMAL LOW (ref 0.61–1.12)

## 2023-07-08 MED ORDER — ACETAMINOPHEN 500 MG PO TABS
1000.0000 mg | ORAL_TABLET | Freq: Once | ORAL | Status: AC
  Administered 2023-07-08: 1000 mg via ORAL
  Filled 2023-07-08: qty 2

## 2023-07-08 NOTE — Evaluation (Signed)
Occupational Therapy Evaluation Patient Details Name: Javier Morrison MRN: [id number] DOB: 03/15/1961 Today's Date: 07/08/2023   History of Present Illness Patient is a 62 year old male with bilateral knee pain, difficulty ambulating, weakness.  Family is concerned that they are unable to care for the patient at home. History of  Down Syndrome, DVT.   Clinical Impression   Patient admitted for the diagnosis above.  PTA he was living with his sister, who was his primary caregiver.  Patient is a poor historian, and needed assist with ADL, and increasing assist with mobility.  Patient does not have the adequate 24 hour assist needed to transition home, and OT recommends SNF level for OT evaluation and most likely LTC placement.  OT will follow in the acute setting.          If plan is discharge home, recommend the following: A lot of help with bathing/dressing/bathroom;A lot of help with walking and/or transfers;Direct supervision/assist for medications management    Functional Status Assessment  Patient has had a recent decline in their functional status and demonstrates the ability to make significant improvements in function in a reasonable and predictable amount of time.  Equipment Recommendations  None recommended by OT    Recommendations for Other Services       Precautions / Restrictions Precautions Precautions: Fall Restrictions Weight Bearing Restrictions: No      Mobility Bed Mobility Overal bed mobility: Needs Assistance Bed Mobility: Sit to Supine, Rolling, Supine to Sit Rolling: Min assist   Supine to sit: Min assist Sit to supine: Min assist        Transfers                          Balance Overall balance assessment: Needs assistance Sitting-balance support: Feet unsupported, No upper extremity supported Sitting balance-Leahy Scale: Good                                     ADL either performed or assessed with clinical judgement    ADL Overall ADL's : Needs assistance/impaired     Grooming: Wash/dry hands;Wash/dry face;Contact guard assist;Sitting   Upper Body Bathing: Minimal assistance;Sitting   Lower Body Bathing: Maximal assistance;Sitting/lateral leans   Upper Body Dressing : Minimal assistance;Moderate assistance;Sitting   Lower Body Dressing: Maximal assistance;Sit to/from stand                       Vision Patient Visual Report: No change from baseline       Perception Perception: Not tested       Praxis Praxis: Not tested       Pertinent Vitals/Pain Pain Assessment Faces Pain Scale: Hurts little more Pain Location: R knee with standing attempts and rolling Pain Descriptors / Indicators: Sore, Guarding, Grimacing     Extremity/Trunk Assessment Upper Extremity Assessment Upper Extremity Assessment: Generalized weakness   Lower Extremity Assessment Lower Extremity Assessment: Defer to PT evaluation   Cervical / Trunk Assessment Cervical / Trunk Assessment: Kyphotic   Communication Communication Communication: Difficulty communicating thoughts/reduced clarity of speech   Cognition Arousal: Alert Behavior During Therapy: WFL for tasks assessed/performed Overall Cognitive Status: History of cognitive impairments - at baseline  General Comments: History of Down Syndrome. Patient is able to follow single step commands consistently with increased time. Occasional cues for sequencing during mobility. Patient is oriented to self only.     General Comments   VSS on RA    Exercises     Shoulder Instructions      Home Living Family/patient expects to be discharged to:: Private residence Living Arrangements: Other relatives Available Help at Discharge: Family Type of Home: House                           Additional Comments: patient is a poor historian and unable to give home set-up information      Prior  Functioning/Environment               Mobility Comments: patient reports his is independent without assistive device. chart indicates recent  inability to walk at home ADLs Comments: patient reports he is independent with toileting and feeding. needs assistance from sister with dressing and bathing        OT Problem List: Decreased strength;Decreased activity tolerance;Impaired balance (sitting and/or standing);Decreased safety awareness;Decreased cognition;Pain      OT Treatment/Interventions: Self-care/ADL training;Therapeutic activities;Patient/family education;Balance training;DME and/or AE instruction    OT Goals(Current goals can be found in the care plan section) Acute Rehab OT Goals OT Goal Formulation: Patient unable to participate in goal setting Time For Goal Achievement: 07/22/23 Potential to Achieve Goals: Fair ADL Goals Pt Will Perform Grooming: with supervision;standing Pt Will Perform Upper Body Bathing: with set-up;sitting Pt Will Perform Upper Body Dressing: with set-up;sitting Pt Will Transfer to Toilet: with contact guard assist;regular height toilet;ambulating  OT Frequency: Min 1X/week    Co-evaluation              AM-PAC OT "6 Clicks" Daily Activity     Outcome Measure Help from another person eating meals?: A Little Help from another person taking care of personal grooming?: A Little Help from another person toileting, which includes using toliet, bedpan, or urinal?: A Lot Help from another person bathing (including washing, rinsing, drying)?: A Lot Help from another person to put on and taking off regular upper body clothing?: A Lot Help from another person to put on and taking off regular lower body clothing?: A Lot 6 Click Score: 14   End of Session Nurse Communication: Mobility status  Activity Tolerance: Patient tolerated treatment well Patient left: in bed;with call bell/phone within reach  OT Visit Diagnosis: Unsteadiness on feet  (R26.81);Pain Pain - Right/Left: Right Pain - part of body: Knee;Leg                Time: 1343-1400 OT Time Calculation (min): 17 min Charges:  OT General Charges $OT Visit: 1 Visit OT Evaluation $OT Eval Moderate Complexity: 1 Mod  07/08/2023  RP, OTR/L  Acute Rehabilitation Services  Office:  [phone]   Suzanna Obey 07/08/2023, 2:06 PM

## 2023-07-08 NOTE — Progress Notes (Signed)
   RE: Javier Morrison Date of Birth:04-Oct-1961  Date: 07/08/2023   To Whom It May Concern:  Please be advised that the above-named patient will require a short-term nursing home stay - anticipated 30 days or less for rehabilitation and strengthening.  The plan is for return home.                 MD signature                Date

## 2023-07-08 NOTE — ED Notes (Signed)
Changed patient's brief, repositioned him in bed.

## 2023-07-08 NOTE — NC FL2 (Signed)
[place] MEDICAID FL2 LEVEL OF CARE FORM     IDENTIFICATION  Patient Name: Javier Morrison Birthdate: 1960-10-17 Sex: male Admission Date (Current Location): 07/07/2023  [facility] and [place] Number:  Producer, television/film/video and Address:  [facility] [place], [address] [place], [place] [address]      Provider Number: [id number]  Attending Physician Name and Address:  No att. providers found  Relative Name and Phone Number:  Darrold Bezek [phone]    Current Level of Care: Hospital Recommended Level of Care: Skilled Nursing Facility Prior Approval Number:    Date Approved/Denied: 07/08/23 PASRR Number: PASRR Pending  Discharge Plan: SNF    Current Diagnoses: There are no problems to display for this patient.   Orientation RESPIRATION BLADDER Height & Weight     Self, Place  Normal Continent Weight: 160 lb (72.6 kg) Height:  5\' 1"  (154.9 cm)  BEHAVIORAL SYMPTOMS/MOOD NEUROLOGICAL BOWEL NUTRITION STATUS      Continent Diet (Regular)  AMBULATORY STATUS COMMUNICATION OF NEEDS Skin   Limited Assist Verbally Normal                       Personal Care Assistance Level of Assistance  Bathing, Feeding Bathing Assistance: Maximum assistance (Alot of help) Feeding assistance: Limited assistance       Functional Limitations Info  Sight, Hearing, Speech Sight Info: Adequate Hearing Info: Adequate Speech Info: Adequate    SPECIAL CARE FACTORS FREQUENCY  PT (By licensed PT), OT (By licensed OT)     PT Frequency: X5 OT Frequency: X5            Contractures Contractures Info: Not present    Additional Factors Info  Code Status, Allergies Code Status Info: Not on file Allergies Info: Myrbetriq (mirabegron)           Current Medications (07/08/2023):  This is the current hospital active medication list Current Facility-Administered Medications  Medication Dose Route Frequency Provider Last Rate Last Admin   amoxicillin-clavulanate  (AUGMENTIN) 875-125 MG per tablet 1 tablet  1 tablet Oral BID Rondel Baton, MD   1 tablet at 07/08/23 0350   aspirin chewable tablet 81 mg  81 mg Oral Daily Renne Crigler, PA-C   81 mg at 07/08/23 0939   atorvastatin (LIPITOR) tablet 40 mg  40 mg Oral Daily Renne Crigler, PA-C   40 mg at 07/08/23 0940   azithromycin (ZITHROMAX) tablet 250 mg  250 mg Oral Daily Rondel Baton, MD   250 mg at 07/08/23 0940   levothyroxine (SYNTHROID) tablet 88 mcg  88 mcg Oral Daily Renne Crigler, PA-C   88 mcg at 07/08/23 0646   loratadine (CLARITIN) tablet 10 mg  10 mg Oral Daily Renne Crigler, PA-C   10 mg at 07/08/23 0938   meloxicam (MOBIC) tablet 15 mg  15 mg Oral Daily Renne Crigler, PA-C   15 mg at 07/08/23 1829   Current Outpatient Medications  Medication Sig Dispense Refill   aspirin 81 MG chewable tablet Chew by mouth daily.     levothyroxine (SYNTHROID, LEVOTHROID) 88 MCG tablet Take 88 mcg by mouth daily.       meloxicam (MOBIC) 15 MG tablet Take 15 mg by mouth daily.         Discharge Medications: Please see discharge summary for a list of discharge medications.  Relevant Imaging Results:  Relevant Lab Results:   Additional Information SSN# [id number]  Georgie Chard, LCSW

## 2023-07-08 NOTE — Progress Notes (Addendum)
CSW has sent out clinical notes for FL2 PASRR went to level 2. CSW has also sent patient out with PASRR pending.

## 2023-07-08 NOTE — ED Notes (Signed)
Pt moved himself to the edge of the bed and was sitting on the end. Pt had removed his brief and peed on the floor. Forest Gleason, ED tech assisted this RN with getting the pt back in the bed. This RN cleaned him up, put on a fresh brief, changed his socks, and cleaned up the floor. Will have EVS mop the floor.

## 2023-07-08 NOTE — ED Provider Notes (Signed)
Emergency Medicine Observation Re-evaluation Note  Javier Morrison is a 62 y.o. male, seen on rounds today.  Pt initially presented to the ED for complaints of Knee Pain (bilateral) Currently, the patient is in boarder status.  I reviewed documentation from yesterday.  Appears the patient presented to one of the stand-alone emergency departments for progressive weakness.  No acute findings besides potential small area of pneumonia.  He is being treated for said potential pneumonia per team yesterday but case was consulted with hospitalist per PA note and declined for admission.  Currently being evaluated by combination of social work and physical therapy/Occupational Therapy this morning.  Notably patient's TSH resulted grossly elevated overnight.  Will add on free T3-T4 as hypothyroidism may be part of patient's syndrome.   Physical Exam  BP 116/61   Pulse (!) 52   Temp 98.3 F (36.8 C) (Oral)   Resp 16   SpO2 100%  Physical Exam General: Appears to be resting comfortably in bed, no acute distress. Cardiac: Regular rate, normal heart rate, non-emergent blood pressure for this morning's vitals. Lungs: No increased work of breathing.  Equal chest rise appreciated Psych: Calm, asleep in bed.   ED Course / MDM  EKG:EKG Interpretation Date/Time:  Thursday July 07 2023 14:48:45 EDT Ventricular Rate:  56 PR Interval:  213 QRS Duration:  102 QT Interval:  429 QTC Calculation: 414 R Axis:   89  Text Interpretation: Sinus rhythm Prolonged PR interval Consider right ventricular hypertrophy Confirmed by Vanetta Mulders [phone]) on 07/07/2023 6:39:18 PM  I have reviewed the labs performed to date as well as medications administered while in observation.    Plan  Current plan is for transitions of care consultation for safe disposition determination after evaluation by physical therapy and Occupational Therapy.  Thyroid studies unlikely to result on this shift will need to be followed up by  oncoming provider at time of result.   Glyn Ade, MD 07/08/23 [phone]

## 2023-07-08 NOTE — Evaluation (Signed)
Physical Therapy Evaluation Patient Details Name: Javier Morrison MRN: [id number] DOB: 03-07-1961 Today's Date: 07/08/2023  History of Present Illness  Patient is a 62 year old male with bilateral knee pain, difficulty ambulating, weakness.  Family is concerned that they are unable to care for the patient at home. History of  Down Syndrome, DVT.   Clinical Impression  Patient is cooperative during evaluation. He was long sitting in bed on arrival to room. No family present. Patient reports he ambulates without assistance at baseline without assistive device. He reports his sister helps with some ADLs, but he can typically feed himself and perform toileting without assistance.  Today, patient required assistance with bed mobility and transfers. Three standing bouts performed with decreased weight acceptance on RLE secondary to knee pain reported with activity. Unable to progress walking due to knee pain. Standing tolerance of less than 10 seconds. The patient does not appear to be at his baseline level of functional independence. Anticipate patient could benefit from rehabilitation after this hospital stay, <3 hours/day.  PT will continue to follow while in the hospital.       If plan is discharge home, recommend the following: A lot of help with walking and/or transfers;A lot of help with bathing/dressing/bathroom;Assistance with cooking/housework;Direct supervision/assist for medications management;Assist for transportation;Help with stairs or ramp for entrance;Supervision due to cognitive status   Can travel by private vehicle   No    Equipment Recommendations  (to be determined at next level of care)  Recommendations for Other Services       Functional Status Assessment Patient has had a recent decline in their functional status and demonstrates the ability to make significant improvements in function in a reasonable and predictable amount of time.     Precautions / Restrictions  Precautions Precautions: Fall Restrictions Weight Bearing Restrictions: No      Mobility  Bed Mobility Overal bed mobility: Needs Assistance Bed Mobility: Sit to Supine, Rolling Rolling: Used rails, Max assist, Contact guard assist     Sit to supine: Min assist   General bed mobility comments: CGA for rolling to the left. Max A for rolling to the right with knee pain reported. Patient long sitting up on arrival to room and required minimal assistance for LE support to return to bed.    Transfers Overall transfer level: Needs assistance Equipment used: 1 person hand held assist Transfers: Sit to/from Stand Sit to Stand: Mod assist           General transfer comment: 3 sit to stand transfer performed with lifting and lowering assistance required. cues for task initiation. decreased weight acceptance on right leg with standing and pain reported in the right knee. unable to progress walking at this time    Ambulation/Gait             Pre-gait activities: emphasis on weight acceptance on RLE in preparation for ambulation. standing tolerance of no more than 10 seconds    Stairs            Wheelchair Mobility     Tilt Bed    Modified Rankin (Stroke Patients Only)       Balance Overall balance assessment: Needs assistance Sitting-balance support: Feet unsupported, No upper extremity supported Sitting balance-Leahy Scale: Fair     Standing balance support: Single extremity supported Standing balance-Leahy Scale: Poor Standing balance comment: external support required and patient relying heavily on stretcher for posterior leg support in standing  Pertinent Vitals/Pain Pain Assessment Pain Assessment: Faces Faces Pain Scale: Hurts even more Pain Location: R knee with standing attempts and rolling Pain Descriptors / Indicators: Sore, Guarding, Grimacing Pain Intervention(s): Limited activity within patient's  tolerance, Monitored during session    Home Living Family/patient expects to be discharged to:: Private residence Living Arrangements: Other relatives (sister) Available Help at Discharge: Family Type of Home: House             Additional Comments: patient is a poor historian and unable to give home set-up information    Prior Function Prior Level of Function : Needs assist;Patient poor historian/Family not available       Physical Assist : ADLs (physical)   ADLs (physical): Grooming;Bathing;Dressing Mobility Comments: patient reports his is independent without assistive device. chart indicates recent  inability to walk at home ADLs Comments: patient reports he is independent with toileting and feeding. needs assistance from sister with dressing and bathing     Extremity/Trunk Assessment   Upper Extremity Assessment Upper Extremity Assessment: Generalized weakness (limited active shoulder flexion bilaterally.)    Lower Extremity Assessment Lower Extremity Assessment: Generalized weakness (pain in the R knee with movement)       Communication   Communication Communication: Difficulty communicating thoughts/reduced clarity of speech (ocasionally, and perseverates on "yes" at times)  Cognition Arousal: Alert Behavior During Therapy: WFL for tasks assessed/performed Overall Cognitive Status: History of cognitive impairments - at baseline                                 General Comments: History of Down Syndrome. Patient is able to follow single step commands consistently with increased time. Occasional cues for sequencing during mobility. Patient is oriented to self only.        General Comments      Exercises     Assessment/Plan    PT Assessment Patient needs continued PT services  PT Problem List Decreased strength;Decreased range of motion;Decreased activity tolerance;Decreased balance;Decreased mobility;Decreased safety awareness;Decreased  cognition;Pain       PT Treatment Interventions DME instruction;Gait training;Stair training;Functional mobility training;Therapeutic activities;Therapeutic exercise;Balance training;Neuromuscular re-education;Cognitive remediation;Patient/family education    PT Goals (Current goals can be found in the Care Plan section)  Acute Rehab PT Goals Patient Stated Goal: per chart, family goal is for placement PT Goal Formulation: Patient unable to participate in goal setting Time For Goal Achievement: 07/15/23 Potential to Achieve Goals: Fair    Frequency Min 1X/week     Co-evaluation               AM-PAC PT "6 Clicks" Mobility  Outcome Measure Help needed turning from your back to your side while in a flat bed without using bedrails?: A Lot Help needed moving from lying on your back to sitting on the side of a flat bed without using bedrails?: A Little Help needed moving to and from a bed to a chair (including a wheelchair)?: A Lot Help needed standing up from a chair using your arms (e.g., wheelchair or bedside chair)?: A Lot Help needed to walk in hospital room?: Total Help needed climbing 3-5 steps with a railing? : Total 6 Click Score: 11    End of Session Equipment Utilized During Treatment: Gait belt Activity Tolerance: Patient tolerated treatment well Patient left: in bed;with call bell/phone within reach (set-up with breakfast tray in bed) Nurse Communication: Mobility status PT Visit Diagnosis: Unsteadiness on feet (R26.81);Muscle weakness (  generalized) (M62.81)    Time: 1610-9604 PT Time Calculation (min) (ACUTE ONLY): 15 min   Charges:   PT Evaluation $PT Eval Low Complexity: 1 Low   PT General Charges $$ ACUTE PT VISIT: 1 Visit        Donna Bernard, PT, MPT   Ina Homes 07/08/2023, 9:25 AM

## 2023-07-09 MED ORDER — ACETAMINOPHEN 325 MG PO TABS
650.0000 mg | ORAL_TABLET | Freq: Once | ORAL | Status: DC
  Filled 2023-07-09: qty 2

## 2023-07-09 NOTE — Progress Notes (Signed)
Name: Javier Morrison DOB: 08-24-1961  Please be advised that the above-named patient will require a short-term nursing home stay -- anticipated 30 days or less for rehabilitation and strengthening. The plan is for return home.  Edwin Dada, MSW, LCSW Transitions of Care  Clinical Social Worker II [phone]

## 2023-07-09 NOTE — ED Provider Notes (Signed)
Emergency Medicine Observation Re-evaluation Note  Javier Morrison is a 62 y.o. male, seen on rounds today.  Pt initially presented to the ED for complaints of Knee Pain (bilateral) Currently, the patient is asleep.  Pt presented to the ED for weakness.  He had been living with his sister and she can no longer care for him. TOC has been working on placement.  Physical Exam  BP (!) 115/59 (BP Location: Right Arm)   Pulse 64   Temp 98.3 F (36.8 C) (Oral)   Resp 18   Ht 5\' 1"  (1.549 m)   Wt 72.6 kg   SpO2 99%   BMI 30.23 kg/m  Physical Exam General: asleep Cardiac: rr Lungs: clear Psych: asleep  ED Course / MDM  EKG:EKG Interpretation Date/Time:  Thursday July 07 2023 14:48:45 EDT Ventricular Rate:  56 PR Interval:  213 QRS Duration:  102 QT Interval:  429 QTC Calculation: 414 R Axis:   89  Text Interpretation: Sinus rhythm Prolonged PR interval Consider right ventricular hypertrophy Confirmed by Vanetta Mulders [phone]) on 07/07/2023 6:39:18 PM  I have reviewed the labs performed to date as well as medications administered while in observation.  Recent changes in the last 24 hours include none.  Plan  Current plan is for SNF placement.    Jacalyn Lefevre, MD 07/09/23 [phone]

## 2023-07-09 NOTE — ED Notes (Signed)
Cleaned pt, changed brief, and changed linens.

## 2023-07-09 NOTE — Progress Notes (Addendum)
11:40am: CSW spoke with patient's nephew Romeo Apple to discuss discharge planning. Ben requesting CSW contact Lauren at [facility] to determine if the facility can offer a bed. CSW explained PASRR process to [place] and he stated understanding.  CSW sent message to Leotis Shames at [facility] requesting she review patient.  10:40am: CSW uploaded documents signed by MD to [facility] to obtain a PASRR. Patient will likely be a level 2 screening due to his medical diagnoses.  Edwin Dada, MSW, LCSW Transitions of Care  Clinical Social Worker II [phone]

## 2023-07-10 LAB — TSH: TSH: 78.636 u[IU]/mL — ABNORMAL HIGH (ref 0.350–4.500)

## 2023-07-10 MED ORDER — LEVOTHYROXINE SODIUM 75 MCG PO TABS
150.0000 ug | ORAL_TABLET | Freq: Every day | ORAL | Status: DC
  Administered 2023-07-10 – 2023-07-12 (×3): 150 ug via ORAL
  Filled 2023-07-10 (×3): qty 2

## 2023-07-10 NOTE — ED Provider Notes (Addendum)
Emergency Medicine Observation Re-evaluation Note  Javier Morrison is a 62 y.o. male, seen on rounds today.  Pt initially presented to the ED for complaints of Knee Pain (bilateral) Currently, the patient is asleep.  Pt was initially seen for knee pain.  He had been living with his sister who can no longer care for him.  He was seen by PT/OT on 9/27 and they recommended SNF.  Pt's TSH was elevated on 9/26.  It is unclear if he was taking his medicines properly prior to admission.  He has been taking synthroid while here.   Physical Exam  BP 122/68 (BP Location: Right Arm)   Pulse 88   Temp 98.2 F (36.8 C) (Oral)   Resp 18   Ht 5\' 1"  (1.549 m)   Wt 72.6 kg   SpO2 100%   BMI 30.23 kg/m  Physical Exam General: asleep Cardiac: rr Lungs: clear Psych: calm  ED Course / MDM  EKG:EKG Interpretation Date/Time:  Thursday July 07 2023 14:48:45 EDT Ventricular Rate:  56 PR Interval:  213 QRS Duration:  102 QT Interval:  429 QTC Calculation: 414 R Axis:   89  Text Interpretation: Sinus rhythm Prolonged PR interval Consider right ventricular hypertrophy Confirmed by Vanetta Mulders [phone]) on 07/07/2023 6:39:18 PM  I have reviewed the labs performed to date as well as medications administered while in observation.  Recent changes in the last 24 hours include TOC working on SNF placement.  Plan  Current plan is for SNF placement.  I have reordered TSH level to be drawn today as well since we know he's been getting his medicines while here.    Jacalyn Lefevre, MD 07/10/23 0731  TSH is worse (78.636), so I will increase synthroid dose.     Jacalyn Lefevre, MD 07/10/23 1032

## 2023-07-10 NOTE — ED Notes (Addendum)
Tasha and I cleaned up and changed the bed at this time

## 2023-07-10 NOTE — ED Notes (Signed)
Gave medication and changed attends.

## 2023-07-10 NOTE — Progress Notes (Signed)
Patient's PASRR screening is still pending at this time.  Edwin Dada, MSW, LCSW Transitions of Care  Clinical Social Worker II [phone]

## 2023-07-11 NOTE — NC FL2 (Signed)
[place] MEDICAID FL2 LEVEL OF CARE FORM     IDENTIFICATION  Patient Name: Javier Morrison Birthdate: 1961/08/27 Sex: male Admission Date (Current Location): 07/07/2023  [facility] and [place] Number:  Producer, television/film/video and Address:  [facility], [address] [place], [place] [address]      Provider Number: [id number]  Attending Physician Name and Address:  Pricilla Loveless, MD  Relative Name and Phone Number:  lack,Ben [facility])  [phone], Mihail Prettyman (sister) - [phone]    Current Level of Care: Hospital Recommended Level of Care: Skilled Nursing Facility Prior Approval Number:    Date Approved/Denied: 07/08/23 PASRR Number: PENDING  Discharge Plan: SNF    Current Diagnoses:  Weakness  Dementia, unspecified dementia severity, unspecified dementia type, unspecified whether behavioral, psychotic, or mood disturbance or anxiety (HCC)   Orientation RESPIRATION BLADDER Height & Weight     Self, Place  Normal Continent Weight: 160 lb (72.6 kg) Height:  5\' 1"  (154.9 cm)  BEHAVIORAL SYMPTOMS/MOOD NEUROLOGICAL BOWEL NUTRITION STATUS      Continent Diet (Regular)  AMBULATORY STATUS COMMUNICATION OF NEEDS Skin   Extensive Assist Verbally Normal                       Personal Care Assistance Level of Assistance  Bathing, Feeding, Dressing Bathing Assistance: Maximum assistance Feeding assistance: Limited assistance Dressing Assistance: Maximum assistance     Functional Limitations Info  Sight, Hearing, Speech Sight Info: Adequate Hearing Info: Adequate Speech Info: Adequate    SPECIAL CARE FACTORS FREQUENCY  PT (By licensed PT), OT (By licensed OT)     PT Frequency: x5/week OT Frequency: x5/week            Contractures Contractures Info: Not present    Additional Factors Info  Code Status, Allergies Code Status Info: Full Allergies Info: Myrbetriq (Mirabegron)           Current Medications (07/11/2023):  This is  the current hospital active medication list Current Facility-Administered Medications  Medication Dose Route Frequency Provider Last Rate Last Admin   acetaminophen (TYLENOL) tablet 650 mg  650 mg Oral Once Derwood Kaplan, MD       amoxicillin-clavulanate (AUGMENTIN) 875-125 MG per tablet 1 tablet  1 tablet Oral BID Rondel Baton, MD   1 tablet at 07/10/23 2203   aspirin chewable tablet 81 mg  81 mg Oral Daily Renne Crigler, PA-C   81 mg at 07/10/23 1033   atorvastatin (LIPITOR) tablet 40 mg  40 mg Oral Daily Renne Crigler, PA-C   40 mg at 07/10/23 1033   azithromycin (ZITHROMAX) tablet 250 mg  250 mg Oral Daily Rondel Baton, MD   250 mg at 07/10/23 1034   levothyroxine (SYNTHROID) tablet 150 mcg  150 mcg Oral Q0600 Jacalyn Lefevre, MD   150 mcg at 07/10/23 1038   loratadine (CLARITIN) tablet 10 mg  10 mg Oral Daily Renne Crigler, PA-C   10 mg at 07/10/23 1034   meloxicam (MOBIC) tablet 15 mg  15 mg Oral Daily Renne Crigler, PA-C   15 mg at 07/10/23 1034   Current Outpatient Medications  Medication Sig Dispense Refill   aspirin 81 MG chewable tablet Chew by mouth daily.     levothyroxine (SYNTHROID, LEVOTHROID) 88 MCG tablet Take 88 mcg by mouth daily.       meloxicam (MOBIC) 15 MG tablet Take 15 mg by mouth daily.         Discharge  Medications: Please see discharge summary for a list of discharge medications.  Relevant Imaging Results:  Relevant Lab Results:   Additional Information SSN: [id number]  Princella Ion, LCSW

## 2023-07-11 NOTE — Progress Notes (Signed)
[facility] is awaiting clinical review from their team. PASRR is pending.

## 2023-07-11 NOTE — Progress Notes (Addendum)
[facility] is requesting updated PT eval. CSW noted PT is signed in. Will follow and submit updated eval.   Addend @ 2:12 PM PASRR: [id number] E  Addend @ 2:45 PM Requested MD order PT to see pt again. CSW noted pickup PT signed in; however, RN reported no one has seen pt.

## 2023-07-11 NOTE — Discharge Planning (Signed)
Licensed Clinical Social Worker is seeking post-discharge placement for this patient at the following level of care: skilled nursing facility    

## 2023-07-11 NOTE — Progress Notes (Addendum)
Contacted registration to upload SSN.  Presented bed offers to pt's sister who requested CSW contacted pt's nephew, Xyler, to present bed offers. [facility] accepted [facility]. Notified Lauren. Will start Auth.   Uploaded requested documents to [employer]. Pasrr pending.   Addend @ 10:59 AM Auth pending for [facility].

## 2023-07-11 NOTE — ED Notes (Addendum)
PT is c/o of a stomach ache. Unable to describe or verbalize where it hurts at and doesn't grimace or make a sound during assessment of stomach. Turned cartoons on for PT and Pt has started watching tv w/o speaking of stomach pain

## 2023-07-11 NOTE — ED Provider Notes (Signed)
Emergency Medicine Observation Re-evaluation Note  Javier Morrison is a 62 y.o. male, seen on rounds today.  Pt initially presented to the ED for complaints of Knee Pain (bilateral) Currently, the patient is sleeping.  Physical Exam  BP 130/70   Pulse (!) 50   Temp (!) 97.5 F (36.4 C) (Oral) Comment: pt. provided with warm blankets  Resp 18   Ht 5\' 1"  (1.549 m)   Wt 72.6 kg   SpO2 100%   BMI 30.23 kg/m  Physical Exam General: asleep Cardiac: asleep Lungs: asleep Psych: asleep  ED Course / MDM  EKG:EKG Interpretation Date/Time:  Thursday July 07 2023 14:48:45 EDT Ventricular Rate:  56 PR Interval:  213 QRS Duration:  102 QT Interval:  429 QTC Calculation: 414 R Axis:   89  Text Interpretation: Sinus rhythm Prolonged PR interval Consider right ventricular hypertrophy Confirmed by Vanetta Mulders [phone]) on 07/07/2023 6:39:18 PM  I have reviewed the labs performed to date as well as medications administered while in observation.  No recent changes in the last 24 hours.  Plan  Current plan is for TOC placement.    Pricilla Loveless, MD 07/11/23 [phone]

## 2023-07-11 NOTE — Progress Notes (Addendum)
5pm: CSW uploaded recent PT note to [employer] to support the insurance authorization request.  3pm: Patient's PASSR is [id number] E, expiration date of 08/10/23.  Edwin Dada, MSW, LCSW Transitions of Care  Clinical Social Worker II [phone]

## 2023-07-11 NOTE — Progress Notes (Signed)
Physical Therapy Treatment Patient Details Name: Javier Morrison MRN: [id number] DOB: December 20, 1960 Today's Date: 07/11/2023   History of Present Illness Patient is a 62 year old male with bilateral knee pain, difficulty ambulating, weakness.  Family is concerned that they are unable to care for the patient at home. History of  Down Syndrome, DVT.    PT Comments  Pt reporting less pain today vs eval, but still complains of bilat knee pain with functional tasks. Pt overall requiring min physical assist to perform bed mobility, transfers, and short-distance room gait. Pt limited by weakness and poor activity tolerance vs baseline. Pt is a high fall risk, and would benefit from continued PT post-acutely. Plan remains appropriate at this time.     If plan is discharge home, recommend the following: A lot of help with bathing/dressing/bathroom;Assistance with cooking/housework;Direct supervision/assist for medications management;Assist for transportation;Help with stairs or ramp for entrance;Supervision due to cognitive status;A little help with walking and/or transfers   Can travel by private vehicle     No  Equipment Recommendations   (to be determined at next level of care)    Recommendations for Other Services       Precautions / Restrictions Precautions Precautions: Fall Restrictions Weight Bearing Restrictions: No     Mobility  Bed Mobility Overal bed mobility: Needs Assistance Bed Mobility: Sit to Supine, Supine to Sit     Supine to sit: Min assist Sit to supine: Min assist   General bed mobility comments: assist for LE lift into and out of bed, boost up once returned to supine    Transfers Overall transfer level: Needs assistance Equipment used: 1 person hand held assist Transfers: Sit to/from Stand Sit to Stand: Min assist           General transfer comment: assist for rise, steady. Stand x3 total, from EOB and chair in room x2    Ambulation/Gait Ambulation/Gait  assistance: Min assist Gait Distance (Feet): 10 Feet (x2) Assistive device:  (bedrail with bilat UEs) Gait Pattern/deviations: Step-to pattern, Decreased stride length, Trunk flexed Gait velocity: decr     General Gait Details: assist to steady, mostly lateral stepping given pt desire for bilat UE support. x2 bouts gait at EOB with seated rest in between   Stairs             Wheelchair Mobility     Tilt Bed    Modified Rankin (Stroke Patients Only)       Balance Overall balance assessment: Needs assistance Sitting-balance support: Feet unsupported, No upper extremity supported Sitting balance-Leahy Scale: Fair     Standing balance support: During functional activity, Bilateral upper extremity supported Standing balance-Leahy Scale: Poor                              Cognition Arousal: Alert Behavior During Therapy: WFL for tasks assessed/performed Overall Cognitive Status: History of cognitive impairments - at baseline                                 General Comments: follows one-step commands with increased time and at times needs repeated cuing/rephrasing to complete task. Pt needing sequencing cues throughout for mobility        Exercises General Exercises - Lower Extremity Long Arc Quad: AROM, Both, 10 reps, Seated    General Comments        Pertinent Vitals/Pain Pain  Assessment Pain Assessment: Faces Faces Pain Scale: Hurts a little bit Pain Location: bilat knees Pain Descriptors / Indicators: Sore, Guarding, Grimacing Pain Intervention(s): Monitored during session, Repositioned, Limited activity within patient's tolerance    Home Living                          Prior Function            PT Goals (current goals can now be found in the care plan section) Acute Rehab PT Goals Patient Stated Goal: per chart, family goal is for placement PT Goal Formulation: Patient unable to participate in goal  setting Time For Goal Achievement: 07/15/23 Potential to Achieve Goals: Fair Progress towards PT goals: Progressing toward goals    Frequency    Min 1X/week      PT Plan      Co-evaluation              AM-PAC PT "6 Clicks" Mobility   Outcome Measure  Help needed turning from your back to your side while in a flat bed without using bedrails?: A Little Help needed moving from lying on your back to sitting on the side of a flat bed without using bedrails?: A Little Help needed moving to and from a bed to a chair (including a wheelchair)?: A Lot Help needed standing up from a chair using your arms (e.g., wheelchair or bedside chair)?: A Lot Help needed to walk in hospital room?: A Lot Help needed climbing 3-5 steps with a railing? : Total 6 Click Score: 13    End of Session Equipment Utilized During Treatment: Gait belt Activity Tolerance: Patient tolerated treatment well Patient left: in bed;with call bell/phone within reach;Other (comment) (on ED stretcher) Nurse Communication: Mobility status PT Visit Diagnosis: Unsteadiness on feet (R26.81);Muscle weakness (generalized) (M62.81)     Time: 0865-7846 PT Time Calculation (min) (ACUTE ONLY): 17 min  Charges:    $Therapeutic Activity: 8-22 mins PT General Charges $$ ACUTE PT VISIT: 1 Visit                     Marye Round, PT DPT Acute Rehabilitation Services Secure Chat Preferred  Office [phone]    Truddie Coco 07/11/2023, 4:48 PM

## 2023-07-12 LAB — T3, FREE: T3, Free: 1.6 pg/mL — ABNORMAL LOW (ref 2.0–4.4)

## 2023-07-12 MED ORDER — MELATONIN 5 MG PO TABS
5.0000 mg | ORAL_TABLET | Freq: Every day | ORAL | Status: DC
  Administered 2023-07-12: 5 mg via ORAL
  Filled 2023-07-12: qty 1

## 2023-07-12 NOTE — ED Notes (Signed)
[facility] notified of transport needs to [facility]

## 2023-07-12 NOTE — Progress Notes (Signed)
CSW received phone call from [facility] that [facility] is approved. Covering CSW was notified via secure chat.

## 2023-07-12 NOTE — Progress Notes (Signed)
CSW contacted patients sister Javier Morrison [phone]. Javier Morrison stated that she is Javier Morrison and she has legal documents from the courthouse if needed. CSW infromed Javier Morrison that Javier brother will be discharging to [facility] today. Javier Morrison is in agreement with this plan.

## 2023-07-12 NOTE — ED Notes (Addendum)
Per Case Management Carollee Herter Legal Guardian notified of patient leaving hospital and going to a facility.

## 2023-07-12 NOTE — ED Provider Notes (Addendum)
  Physical Exam  BP 91/64   Pulse (!) 40   Temp 97.6 F (36.4 C) (Oral)   Resp (!) 22   Ht 5\' 1"  (1.549 m)   Wt 72.6 kg   SpO2 100%   BMI 30.23 kg/m   Physical Exam  Procedures  Procedures  ED Course / MDM    Medical Decision Making Amount and/or Complexity of Data Reviewed Labs: ordered. Radiology: ordered.  Risk OTC drugs. Prescription drug management.   Pending skilled nursing placement.  Transition of care following.       Benjiman Core, MD 07/12/23 [phone]  Accepted at [facility] rehab for skilled nursing placement    Benjiman Core, MD 07/12/23 [phone]

## 2023-07-12 NOTE — ED Notes (Signed)
Pt ambulated to the bathroom.  

## 2023-07-12 NOTE — ED Notes (Signed)
Help get patient changed placed another pad patient is resting with call bell in reach

## 2023-07-12 NOTE — ED Notes (Signed)
Help get patient changed place another brief got patient another warm blanket patient is resting with call bell in reach

## 2023-07-12 NOTE — ED Notes (Signed)
Got patient cleaned up placed a brief on got patient new sheets and a warm blanket patient is now sitting up with call bell in reach

## 2024-09-10 DEATH — deceased
